# Patient Record
Sex: Male | Born: 1955 | Race: White | Hispanic: No | Marital: Married | Smoking: Never smoker
Health system: Southern US, Community
[De-identification: ages and names within clinical notes are randomized; demographics above are authoritative.]

## PROBLEM LIST (undated history)

## (undated) DIAGNOSIS — J301 Allergic rhinitis due to pollen: Secondary | ICD-10-CM

## (undated) DIAGNOSIS — I10 Essential (primary) hypertension: Secondary | ICD-10-CM

## (undated) DIAGNOSIS — N200 Calculus of kidney: Secondary | ICD-10-CM

## (undated) DIAGNOSIS — E119 Type 2 diabetes mellitus without complications: Principal | ICD-10-CM

## (undated) DIAGNOSIS — E785 Hyperlipidemia, unspecified: Secondary | ICD-10-CM

## (undated) HISTORY — DX: Type 2 diabetes mellitus without complications: E11.9

## (undated) HISTORY — DX: Hyperlipidemia, unspecified: E78.5

## (undated) HISTORY — DX: Allergic rhinitis due to pollen: J30.1

## (undated) HISTORY — DX: Essential (primary) hypertension: I10

## (undated) HISTORY — DX: Calculus of kidney: N20.0

---
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
## 1974-02-17 HISTORY — PX: SEPTOPLASTY: SUR1290

## 2012-05-03 ENCOUNTER — Telehealth: Payer: Self-pay | Admitting: Family Medicine

## 2012-05-03 NOTE — Telephone Encounter (Signed)
Pt just moved here from [place] and has only 30 days left of his meds, however the first new pt apptmt you have is 07/15/2012 at 2:00 p.m. Can you accommodate him a sooner apptmt due to his med refills? Thank you.

## 2012-05-03 NOTE — Telephone Encounter (Signed)
i should be able to see him on wed or thurs. 30 min new appt.

## 2012-05-04 NOTE — Telephone Encounter (Signed)
I made several attempts to contact patient all day.  The call goes directly to his voice mail that hasn't been set up, yet.

## 2012-05-05 NOTE — Telephone Encounter (Signed)
We are doing him a favor working him in, if he does not call back, let it go.

## 2012-05-05 NOTE — Telephone Encounter (Signed)
Noted  

## 2012-07-15 ENCOUNTER — Other Ambulatory Visit: Payer: Self-pay | Admitting: *Deleted

## 2012-07-15 ENCOUNTER — Encounter: Payer: Self-pay | Admitting: Family Medicine

## 2012-07-15 ENCOUNTER — Ambulatory Visit (INDEPENDENT_AMBULATORY_CARE_PROVIDER_SITE_OTHER): Payer: 59 | Admitting: Family Medicine

## 2012-07-15 VITALS — BP 140/92 | HR 92 | Temp 98.1°F | Ht 68.0 in | Wt 245.5 lb

## 2012-07-15 DIAGNOSIS — N200 Calculus of kidney: Secondary | ICD-10-CM

## 2012-07-15 DIAGNOSIS — E785 Hyperlipidemia, unspecified: Secondary | ICD-10-CM

## 2012-07-15 DIAGNOSIS — J301 Allergic rhinitis due to pollen: Secondary | ICD-10-CM | POA: Insufficient documentation

## 2012-07-15 DIAGNOSIS — I10 Essential (primary) hypertension: Secondary | ICD-10-CM

## 2012-07-15 MED ORDER — TADALAFIL 20 MG PO TABS: 20.0000 mg | ORAL_TABLET | Freq: Every day | ORAL | Status: DC | PRN

## 2012-07-15 NOTE — Patient Instructions (Addendum)
F/u late 2014 or early 2015 for CPX

## 2012-07-15 NOTE — Progress Notes (Signed)
Nature conservation officer at [facility] [address] [place] [address] Phone: [phone] Fax: [phone]  Date:  07/15/2012   Name:  Matthew Cooper   DOB:  1955/08/19   MRN:  [id number] Gender: male Age: 57 y.o.  Primary Physician:  Hannah Beat, MD  Evaluating MD: Hannah Beat, MD   Chief Complaint: Establish Care   History of Present Illness:  Matthew Cooper is a 57 y.o. pleasant patient who presents with the following:  Moved from [place] in February - Chartered certified accountant for [employer].   HTN: Tolerating all medications without side effects No CP, no sob. No HA.  BP Readings from Last 3 Encounters:  07/15/12 140/92   Lipids: reportedly at goal We will obtain records from the patient's prior physicians.   ED: on cialis works well.  Basic Metabolic Panel: No results found for this basename: na, k, cl, co2, bun, creatinine, glucose, calcium      There are no active problems to display for this patient.   Past Medical History  Diagnosis Date  . History of chicken pox   . Allergy   . Hypertension   . Hyperlipidemia   . Kidney stones     Past Surgical History  Procedure Laterality Date  . Tonsillectomy and adenoidectomy  1961    History   Social History  . Marital Status: Married    Spouse Name: N/A    Number of Children: N/A  . Years of Education: N/A   Occupational History  . Not on file.   Social History Main Topics  . Smoking status: Never Smoker   . Smokeless tobacco: Never Used  . Alcohol Use: No  . Drug Use: No  . Sexually Active: Not on file   Other Topics Concern  . Not on file   Social History Narrative  . No narrative on file    No family history on file.  No Known Allergies  Medication list has been reviewed and updated.  No outpatient prescriptions prior to visit.   No facility-administered medications prior to visit.    Review of Systems:   GEN: No acute illnesses, no fevers,  chills. GI: No n/v/d, eating normally Pulm: No SOB Interactive and getting along well at home.  Otherwise, ROS is as per the HPI.   Physical Examination: BP 140/92  Pulse 92  Temp(Src) 98.1 F (36.7 C) (Oral)  Ht 5\' 8"  (1.727 m)  Wt 245 lb 8 oz (111.358 kg)  BMI 37.34 kg/m2  SpO2 96%  Ideal Body Weight: Weight in (lb) to have BMI = 25: 164.1   GEN: WDWN, NAD, Non-toxic, A & O x 3 HEENT: Atraumatic, Normocephalic. Neck supple. No masses, No LAD. Ears and Nose: No external deformity. CV: RRR, No M/G/R. No JVD. No thrill. No extra heart sounds. PULM: CTA B, no wheezes, crackles, rhonchi. No retractions. No resp. distress. No accessory muscle use. EXTR: No c/c/e NEURO Normal gait.  PSYCH: Normally interactive. Conversant. Not depressed or anxious appearing.  Calm demeanor.    Assessment and Plan:  Hypertension  Hyperlipidemia  Allergic rhinitis due to pollen  Kidney stones  Doing well. Refill cialis. F/u cpx late fall  Orders Today:  No orders of the defined types were placed in this encounter.    Updated Medication List: (Includes new medications, updates to list, dose adjustments) Meds ordered this encounter  Medications  . Chlorpheniramine-DM (CORICIDIN COUGH/COLD) 4-30 MG TABS    Sig: Take by mouth as needed.  Marland Kitchen  tadalafil (CIALIS) 20 MG tablet    Sig: Take 1 tablet (20 mg total) by mouth daily as needed for erectile dysfunction.    Dispense:  24 tablet    Refill:  3    Medications Discontinued: There are no discontinued medications.    Signed, Elpidio Galea. Daran Favaro, MD 07/15/2012 2:03 PM

## 2013-01-20 ENCOUNTER — Other Ambulatory Visit: Payer: Self-pay | Admitting: *Deleted

## 2013-01-20 MED ORDER — ROSUVASTATIN CALCIUM 10 MG PO TABS: 10.0000 mg | ORAL_TABLET | Freq: Every day | ORAL | Status: DC

## 2013-01-20 MED ORDER — HYDROCHLOROTHIAZIDE 12.5 MG PO CAPS: 12.5000 mg | ORAL_CAPSULE | Freq: Every day | ORAL | Status: DC

## 2013-01-20 MED ORDER — QUINAPRIL HCL 40 MG PO TABS: 40.0000 mg | ORAL_TABLET | Freq: Every day | ORAL | Status: DC

## 2013-01-20 NOTE — Addendum Note (Signed)
Addended by: Baldomero Lamy on: 01/20/2013 01:49 PM   Modules accepted: Orders

## 2013-03-17 ENCOUNTER — Other Ambulatory Visit (INDEPENDENT_AMBULATORY_CARE_PROVIDER_SITE_OTHER): Payer: 59

## 2013-03-17 DIAGNOSIS — Z79899 Other long term (current) drug therapy: Secondary | ICD-10-CM

## 2013-03-17 DIAGNOSIS — Z125 Encounter for screening for malignant neoplasm of prostate: Secondary | ICD-10-CM

## 2013-03-17 DIAGNOSIS — E785 Hyperlipidemia, unspecified: Secondary | ICD-10-CM

## 2013-03-17 LAB — BASIC METABOLIC PANEL
BUN: 16 mg/dL (ref 6–23)
CHLORIDE: 102 meq/L (ref 96–112)
CO2: 29 mEq/L (ref 19–32)
Calcium: 9.8 mg/dL (ref 8.4–10.5)
Creatinine, Ser: 1 mg/dL (ref 0.4–1.5)
GFR: 81.5 mL/min (ref >60.00)
GLUCOSE: 106 mg/dL — AB (ref 70–99)
POTASSIUM: 4.2 meq/L (ref 3.5–5.1)
Sodium: 139 mEq/L (ref 135–145)

## 2013-03-17 LAB — LIPID PANEL
CHOL/HDL RATIO: 5
Cholesterol: 144 mg/dL (ref 0–200)
HDL: 30.4 mg/dL — ABNORMAL LOW (ref >39.00)
LDL CALC: 80 mg/dL (ref 0–99)
Triglycerides: 167 mg/dL — ABNORMAL HIGH (ref 0.0–149.0)
VLDL: 33.4 mg/dL (ref 0.0–40.0)

## 2013-03-17 LAB — CBC WITH DIFFERENTIAL/PLATELET
Basophils Absolute: 0 10*3/uL (ref 0.0–0.1)
Basophils Relative: 0.2 % (ref 0.0–3.0)
EOS ABS: 0.1 10*3/uL (ref 0.0–0.7)
EOS PCT: 1 % (ref 0.0–5.0)
HCT: 49.2 % (ref 39.0–52.0)
Hemoglobin: 16.3 g/dL (ref 13.0–17.0)
LYMPHS PCT: 30.1 % (ref 12.0–46.0)
Lymphs Abs: 2.5 10*3/uL (ref 0.7–4.0)
MCHC: 33.1 g/dL (ref 30.0–36.0)
MCV: 98.7 fl (ref 78.0–100.0)
Monocytes Absolute: 0.7 10*3/uL (ref 0.1–1.0)
Monocytes Relative: 8.4 % (ref 3.0–12.0)
NEUTROS PCT: 60.3 % (ref 43.0–77.0)
Neutro Abs: 5 10*3/uL (ref 1.4–7.7)
Platelets: 217 10*3/uL (ref 150.0–400.0)
RBC: 4.99 Mil/uL (ref 4.22–5.81)
RDW: 12.8 % (ref 11.5–14.6)
WBC: 8.3 10*3/uL (ref 4.5–10.5)

## 2013-03-17 LAB — HEPATIC FUNCTION PANEL
ALBUMIN: 4.4 g/dL (ref 3.5–5.2)
ALK PHOS: 56 U/L (ref 39–117)
ALT: 77 U/L — ABNORMAL HIGH (ref 0–53)
AST: 53 U/L — ABNORMAL HIGH (ref 0–37)
BILIRUBIN DIRECT: 0.1 mg/dL (ref 0.0–0.3)
BILIRUBIN TOTAL: 1 mg/dL (ref 0.3–1.2)
Total Protein: 6.9 g/dL (ref 6.0–8.3)

## 2013-03-17 LAB — PSA: PSA: 0.38 ng/mL (ref 0.10–4.00)

## 2013-03-23 ENCOUNTER — Encounter: Payer: 59 | Admitting: Family Medicine

## 2013-04-04 ENCOUNTER — Ambulatory Visit (INDEPENDENT_AMBULATORY_CARE_PROVIDER_SITE_OTHER): Payer: 59 | Admitting: Family Medicine

## 2013-04-04 ENCOUNTER — Encounter: Payer: Self-pay | Admitting: Family Medicine

## 2013-04-04 VITALS — BP 140/90 | HR 81 | Temp 97.0°F | Ht 68.0 in | Wt 234.6 lb

## 2013-04-04 DIAGNOSIS — Z23 Encounter for immunization: Secondary | ICD-10-CM

## 2013-04-04 DIAGNOSIS — Z0000 Encounter for general adult medical examination without abnormal findings: Secondary | ICD-10-CM

## 2013-04-04 DIAGNOSIS — Z1211 Encounter for screening for malignant neoplasm of colon: Secondary | ICD-10-CM

## 2013-04-04 MED ORDER — ROSUVASTATIN CALCIUM 10 MG PO TABS: 10.0000 mg | ORAL_TABLET | Freq: Every day | ORAL | Status: DC

## 2013-04-04 MED ORDER — SILDENAFIL CITRATE 100 MG PO TABS: 100.0000 mg | ORAL_TABLET | Freq: Every day | ORAL | Status: DC | PRN

## 2013-04-04 MED ORDER — HYDROCHLOROTHIAZIDE 12.5 MG PO CAPS: 12.5000 mg | ORAL_CAPSULE | Freq: Every day | ORAL | Status: DC

## 2013-04-04 MED ORDER — QUINAPRIL HCL 40 MG PO TABS: 40.0000 mg | ORAL_TABLET | Freq: Every day | ORAL | Status: DC

## 2013-04-04 NOTE — Progress Notes (Signed)
Date:  04/04/2013   Name:  Matthew Cooper   DOB:  [birth year]   MRN:  [id number] Gender: male Age: 58 y.o.  Primary Physician:  Owens Loffler, MD   Chief Complaint: Annual Exam   Subjective:   History of Present Illness:  Matthew Cooper is a 58 y.o. pleasant patient who presents with the following:  Preventative Health Maintenance Visit:  Health Maintenance Summary Reviewed and updated, unless pt declines services.  Tobacco History Reviewed. Alcohol: No concerns, no excessive use Exercise Habits: Some activity, rec at least 30 mins 5 times a week STD concerns: no risk or activity to increase risk Drug Use: None Encouraged self-testicular check  Health Maintenance  Topic Date Due  . Colonoscopy  12/19/2004  . Influenza Vaccine  09/17/2012  . Tetanus/tdap  04/05/2023    Immunization History  Administered Date(s) Administered  . Tdap 04/04/2013   Tdap - > 10 years Colon - done in [place], [place], reports he is due Getting Hep B shots from the city  [place] in [place], and went with some family. Went down to [facility].   Patient Active Problem List   Diagnosis Date Noted  . Hypertension   . Hyperlipidemia   . Allergic rhinitis due to pollen   . Kidney stones     Past Medical History  Diagnosis Date  . Allergic rhinitis due to pollen   . Hypertension   . Hyperlipidemia   . Kidney stones     Past Surgical History  Procedure Laterality Date  . Tonsillectomy and adenoidectomy  1961    History   Social History  . Marital Status: Married    Spouse Name: N/A    Number of Children: N/A  . Years of Education: 12   Occupational History  .  Unemployed    Freight forwarder, [employer]   Social History Main Topics  . Smoking status: Never Smoker   . Smokeless tobacco: Never Used  . Alcohol Use: No  . Drug Use: No  . Sexual Activity: Not on file   Other Topics Concern  . Not on file   Social History Narrative   Moved 03/2012 from [facility]     Married    No family history on file.  No Known Allergies  Medication list has been reviewed and updated.  Review of Systems:  General: Denies fever, chills, sweats. No significant weight loss. Eyes: Denies blurring,significant itching ENT: Denies earache, sore throat, and hoarseness. Cardiovascular: Denies chest pains, palpitations, dyspnea on exertion Respiratory: Denies cough, dyspnea at rest,wheeezing Breast: no concerns about lumps GI: Denies nausea, vomiting, diarrhea, constipation, change in bowel habits, abdominal pain, melena, hematochezia GU: Denies penile discharge, ED, urinary flow / outflow problems. No STD concerns. Musculoskeletal: Denies back pain, joint pain Derm: Denies rash, itching Neuro: Denies  paresthesias, frequent falls, frequent headaches Psych: Denies depression, anxiety Endocrine: Denies cold intolerance, heat intolerance, polydipsia Heme: Denies enlarged lymph nodes Allergy: No hayfever  Objective:   Physical Examination: BP 140/90  Pulse 81  Temp(Src) 97 F (36.1 C) (Oral)  Ht 5\' 8"  (1.727 m)  Wt 234 lb 9 oz (106.397 kg)  BMI 35.67 kg/m2  SpO2 96% Ideal Body Weight: Weight in (lb) to have BMI = 25: 164.1  GEN: well developed, well nourished, no acute distress Eyes: conjunctiva and lids normal, PERRLA, EOMI ENT: TM clear, nares clear, oral exam WNL Neck: supple, no lymphadenopathy, no thyromegaly, no JVD Pulm: clear to auscultation and percussion, respiratory effort normal CV: regular rate  and rhythm, S1-S2, no murmur, rub or gallop, no bruits, peripheral pulses normal and symmetric, no cyanosis, clubbing, edema or varicosities GI: soft, non-tender; no hepatosplenomegaly, masses; active bowel sounds all quadrants GU: no hernia, testicular mass, penile discharge Lymph: no cervical, axillary or inguinal adenopathy MSK: gait normal, muscle tone and strength WNL, no joint swelling, effusions, discoloration, crepitus  SKIN: clear, good  turgor, color WNL, no rashes, lesions, or ulcerations Neuro: normal mental status, normal strength, sensation, and motion Psych: alert; oriented to person, place and time, normally interactive and not anxious or depressed in appearance.  All labs reviewed with patient.  Lipids:    Component Value Date/Time   CHOL 144 03/17/2013 0746   TRIG 167.0* 03/17/2013 0746   HDL 30.40* 03/17/2013 0746   VLDL 33.4 03/17/2013 0746   CHOLHDL 5 03/17/2013 0746    CBC:    Component Value Date/Time   WBC 8.3 03/17/2013 0746   HGB 16.3 03/17/2013 0746   HCT 49.2 03/17/2013 0746   PLT 217.0 03/17/2013 0746   MCV 98.7 03/17/2013 0746   NEUTROABS 5.0 03/17/2013 0746   LYMPHSABS 2.5 03/17/2013 0746   MONOABS 0.7 03/17/2013 0746   EOSABS 0.1 03/17/2013 0746   BASOSABS 0.0 03/17/2013 1540    Basic Metabolic Panel:    Component Value Date/Time   NA 139 03/17/2013 0746   K 4.2 03/17/2013 0746   CL 102 03/17/2013 0746   CO2 29 03/17/2013 0746   BUN 16 03/17/2013 0746   CREATININE 1.0 03/17/2013 0746   GLUCOSE 106* 03/17/2013 0746   CALCIUM 9.8 03/17/2013 0746    Lab Results  Component Value Date   ALT 77* 03/17/2013   AST 53* 03/17/2013   ALKPHOS 56 03/17/2013   BILITOT 1.0 03/17/2013    No results found for this basename: TSH    Lab Results  Component Value Date   PSA 0.38 03/17/2013    Assessment & Plan:   Health Maintenance Exam: The patient's preventative maintenance and recommended screening tests for an annual wellness exam were reviewed in full today. Brought up to date unless services declined.  Counselled on the importance of diet, exercise, and its role in overall health and mortality. The patient's FH and SH was reviewed, including their home life, tobacco status, and drug and alcohol status.   Routine general medical examination at a health care facility - Plan: Tdap vaccine greater than or equal to 7yo IM  Special screening for malignant neoplasms, colon - Plan: Ambulatory referral to  Gastroenterology  Need for prophylactic vaccination with combined diphtheria-tetanus-pertussis (DTP) vaccine - Plan: Tdap vaccine greater than or equal to 7yo IM  Mild LFT elevation. Suggested recheck LFT's in 3-4 months to ensure stability.  Patient Instructions  REFERRALS TO SPECIALISTS, SPECIAL TESTS (MRI, CT, ULTRASOUNDS)  GO THE WAITING ROOM AND TELL CHECK IN YOU NEED HELP WITH A REFERRAL. Either MARION or LINDA will help you set it up.  If it is between 1-2 PM they may be at lunch.  After 5 PM, they will likely be at home.  They will call you, so please make sure the office has your correct phone number.  Referrals sometimes can be done same day if urgent, but others can take 2 or 3 days to get an appointment. Starting in 2015, some of the new Medicare insurance plans and [place] offered on the Exchange take longer for referrals. They have added additional paperwork and steps.  MRI's and CT's can take up  to a week for the test. (Emergencies like strokes take precedence. I will tell you if you have an emergency.)   Specialist appointment times vary a great deal, mostly on the specialist's schedule and if they have openings. -- Our office tries to get you in as fast as possible. -- Some specialists have very long wait times. (Example. Dermatology. Usually months) -- If you have a true emergency like new cancer, we work to get you in ASAP.     Orders Placed This Encounter  Procedures  . Tdap vaccine greater than or equal to 7yo IM  . Ambulatory referral to Gastroenterology    New medications, updates to list, dose adjustments: Meds ordered this encounter  Medications  . rosuvastatin (CRESTOR) 10 MG tablet    Sig: Take 1 tablet (10 mg total) by mouth daily.    Dispense:  90 tablet    Refill:  3  . hydrochlorothiazide (MICROZIDE) 12.5 MG capsule    Sig: Take 1 capsule (12.5 mg total) by mouth daily.    Dispense:  90 capsule    Refill:  3  . quinapril  (ACCUPRIL) 40 MG tablet    Sig: Take 1 tablet (40 mg total) by mouth daily.    Dispense:  90 tablet    Refill:  3  . sildenafil (VIAGRA) 100 MG tablet    Sig: Take 1 tablet (100 mg total) by mouth daily as needed for erectile dysfunction.    Dispense:  24 tablet    Refill:  3    Signed,  Kendi Defalco T. Khaleah Duer, MD, [place] at [facility] [place] [address] Phone: [phone] Fax: [phone]    Medication List       This list is accurate as of: 04/04/13 11:59 PM.  Always use your most recent med list.               CORICIDIN COUGH/COLD 4-30 MG Tabs  Generic drug:  Chlorpheniramine-DM  Take by mouth as needed.     hydrochlorothiazide 12.5 MG capsule  Commonly known as:  MICROZIDE  Take 1 capsule (12.5 mg total) by mouth daily.     quinapril 40 MG tablet  Commonly known as:  ACCUPRIL  Take 1 tablet (40 mg total) by mouth daily.     rosuvastatin 10 MG tablet  Commonly known as:  CRESTOR  Take 1 tablet (10 mg total) by mouth daily.     sildenafil 100 MG tablet  Commonly known as:  VIAGRA  Take 1 tablet (100 mg total) by mouth daily as needed for erectile dysfunction.

## 2013-04-04 NOTE — Progress Notes (Signed)
Pre-visit discussion using our clinic review tool. No additional management support is needed unless otherwise documented below in the visit note.  

## 2013-04-04 NOTE — Patient Instructions (Signed)
REFERRALS TO SPECIALISTS, SPECIAL TESTS (MRI, CT, ULTRASOUNDS)  GO THE WAITING ROOM AND TELL CHECK IN YOU NEED HELP WITH A REFERRAL. Either MARION or LINDA will help you set it up.  If it is between 1-2 PM they may be at lunch.  After 5 PM, they will likely be at home.  They will call you, so please make sure the office has your correct phone number.  Referrals sometimes can be done same day if urgent, but others can take 2 or 3 days to get an appointment. Starting in 2015, some of the new Medicare insurance plans and Affordable Care Health plans offered on the Exchange take longer for referrals. They have added additional paperwork and steps.  MRI's and CT's can take up to a week for the test. (Emergencies like strokes take precedence. I will tell you if you have an emergency.)   Specialist appointment times vary a great deal, mostly on the specialist's schedule and if they have openings. -- Our office tries to get you in as fast as possible. -- Some specialists have very long wait times. (Example. Dermatology. Usually months) -- If you have a true emergency like new cancer, we work to get you in ASAP.   

## 2013-04-07 ENCOUNTER — Encounter: Payer: Self-pay | Admitting: Internal Medicine

## 2013-05-04 ENCOUNTER — Telehealth: Payer: Self-pay | Admitting: *Deleted

## 2013-05-04 ENCOUNTER — Ambulatory Visit (AMBULATORY_SURGERY_CENTER): Payer: Self-pay | Admitting: *Deleted

## 2013-05-04 VITALS — Ht 67.0 in | Wt 230.0 lb

## 2013-05-04 DIAGNOSIS — Z860100 Personal history of colon polyps, unspecified: Secondary | ICD-10-CM

## 2013-05-04 MED ORDER — MOVIPREP 100 G PO SOLR: ORAL | Status: DC

## 2013-05-04 NOTE — Telephone Encounter (Signed)
Dr Hilarie Fredrickson: pt is scheduled for direct colonoscopy 05/18/13.  Last colonoscopy 08/2008 in [place], [place].  Rectal hyperplastic polyp.  Pt brought procedure and pathology report to [facility].  Records are on your desk for review.  Pt is not having any GI problems, no family hx of colon cancer. Brother had colon polyps.  Is pt due for colon now?  Thanks, Juliann Pulse

## 2013-05-04 NOTE — Progress Notes (Signed)
No allergies to eggs or soy. No problems with anesthesia.  

## 2013-05-09 NOTE — Telephone Encounter (Signed)
Notified patient of Dr.Pyrtle's recommendations to proceed with colonoscopy now. Patient states he's going to check with his insurance. He states he will cancel if not covered by insurance. Thanks.

## 2013-05-09 NOTE — Telephone Encounter (Signed)
I have reviewed the patient's colonoscopy which was dated 08/29/2008. The indication was screening and family history of colon polyps. Findings 7 mm sessile sigmoid polyp removed with cold snare. Pathology hyperplastic polyp Gastroenterologist, Dr. Lorie Apley recommended repeat colonoscopy in 5 years if not adenomatous. Given these findings and recommendation, patient okay to proceed to repeat surveillance colonoscopy at this time (now 5 yrs from previous procedure)

## 2013-05-18 ENCOUNTER — Ambulatory Visit (AMBULATORY_SURGERY_CENTER): Payer: 59 | Admitting: Internal Medicine

## 2013-05-18 ENCOUNTER — Encounter: Payer: Self-pay | Admitting: Internal Medicine

## 2013-05-18 VITALS — BP 106/74 | HR 69 | Temp 98.0°F | Resp 18 | Ht 67.0 in | Wt 230.0 lb

## 2013-05-18 DIAGNOSIS — D126 Benign neoplasm of colon, unspecified: Secondary | ICD-10-CM

## 2013-05-18 DIAGNOSIS — Z860100 Personal history of colon polyps, unspecified: Secondary | ICD-10-CM

## 2013-05-18 DIAGNOSIS — Z1211 Encounter for screening for malignant neoplasm of colon: Secondary | ICD-10-CM

## 2013-05-18 MED ORDER — SODIUM CHLORIDE 0.9 % IV SOLN: 500.0000 mL | INTRAVENOUS | Status: DC

## 2013-05-18 NOTE — Patient Instructions (Signed)

## 2013-05-18 NOTE — Progress Notes (Signed)
Patient denies any allergies to eggs or soy. Patient denies any problems with anesthesia.  

## 2013-05-18 NOTE — Op Note (Signed)
[place]  [address] [place], [address]   COLONOSCOPY PROCEDURE REPORT  PATIENT: Matthew Cooper, Matthew Cooper  MR#: [id number] BIRTHDATE: Dec 02, 1955 , 13  yrs. old GENDER: Male ENDOSCOPIST: Jerene Bears, MD REFERRED QP:[id number] Celedonio Savage, M.D. PROCEDURE DATE:  05/18/2013 PROCEDURE:   Colonoscopy with cold biopsy polypectomy First Screening Colonoscopy - Avg.  risk and is 50 yrs.  old or older - No.  Prior Negative Screening - Now for repeat screening. N/A  History of Adenoma - Now for follow-up colonoscopy & has been > or = to 3 yrs.  Yes hx of adenoma.  Has been 3 or more years since last colonoscopy.  Polyps Removed Today? Yes. ASA CLASS:   Class II INDICATIONS:elevated risk screening, Patient's personal history of adenomatous colon polyps, and Last colonoscopy performed 5 years ago. MEDICATIONS: MAC sedation, administered by CRNA and propofol (Diprivan) 300mg  IV  DESCRIPTION OF PROCEDURE:   After the risks benefits and alternatives of the procedure were thoroughly explained, informed consent was obtained.  A digital rectal exam revealed several skin tags.   The LB [id number]  endoscope was introduced through the anus and advanced to the cecum, which was identified by both the appendix and ileocecal valve. No adverse events experienced. The quality of the prep was good, using MoviPrep  The instrument was then slowly withdrawn as the colon was fully examined.   COLON FINDINGS: Two sessile polyps measuring 3-4 mm in size were found in the ascending colon and rectum.  Polypectomy was performed with cold forceps.  All resections were complete and all polyp tissue was completely retrieved.   The colon mucosa was otherwise normal.  Retroflexed views revealed no abnormalities. The time to cecum=2 minutes 29 seconds.  Withdrawal time=10 minutes 01 seconds. The scope was withdrawn and the procedure completed. COMPLICATIONS: There were no complications.  ENDOSCOPIC  IMPRESSION: 1.   Two sessile polyps measuring 3-4 mm in size were found in the ascending colon and rectum; Polypectomy was performed with cold forceps 2.   The colon mucosa was otherwise normal  RECOMMENDATIONS: 1.  Await pathology results 2.  Repeat Colonoscopy in 5 years. 3.  You will receive a letter within 1-2 weeks with the results of your biopsy as well as final recommendations.  Please call my office if you have not received a letter after 3 weeks.   eSigned:  Jerene Bears, MD 05/18/2013 10:33 AM     cc: The Patient and Kathryne Eriksson, MD

## 2013-05-18 NOTE — Progress Notes (Signed)
Called to room to assist during endoscopic procedure.  Patient ID and intended procedure confirmed with present staff. Received instructions for my participation in the procedure from the performing physician.  

## 2013-05-18 NOTE — Progress Notes (Signed)
Report to pacu rn, vss, bbs=clear 

## 2013-05-19 ENCOUNTER — Telehealth: Payer: Self-pay

## 2013-05-19 NOTE — Telephone Encounter (Signed)
  Follow up Call-  Call back number 05/18/2013  Post procedure Call Back phone  # [phone] cell  Permission to leave phone message Yes     Patient questions:  Do you have a fever, pain , or abdominal swelling? no Pain Score  0 *  Have you tolerated food without any problems? yes  Have you been able to return to your normal activities? yes  Do you have any questions about your discharge instructions: Diet   no Medications  no Follow up visit  no  Do you have questions or concerns about your Care? no  Actions: * If pain score is 4 or above: No action needed, pain <4.

## 2013-05-29 ENCOUNTER — Encounter: Payer: Self-pay | Admitting: Internal Medicine

## 2013-06-02 ENCOUNTER — Telehealth: Payer: Self-pay

## 2013-06-02 NOTE — Telephone Encounter (Signed)
Pt has previously seen chiropractor for shoulder and neck pain; pt wanted to know if Dr Lorelei Pont should do referral or not. I advised pt I could schedule appt for pt with Dr Lorelei Pont but he said he would call and schedule appt with chiropractor himself.

## 2014-04-19 ENCOUNTER — Ambulatory Visit (INDEPENDENT_AMBULATORY_CARE_PROVIDER_SITE_OTHER): Payer: 59 | Admitting: Family Medicine

## 2014-04-19 ENCOUNTER — Encounter: Payer: Self-pay | Admitting: Family Medicine

## 2014-04-19 VITALS — BP 152/106 | HR 76 | Temp 98.3°F | Ht 65.75 in | Wt 245.8 lb

## 2014-04-19 DIAGNOSIS — I10 Essential (primary) hypertension: Secondary | ICD-10-CM

## 2014-04-19 DIAGNOSIS — J0100 Acute maxillary sinusitis, unspecified: Secondary | ICD-10-CM

## 2014-04-19 DIAGNOSIS — E785 Hyperlipidemia, unspecified: Secondary | ICD-10-CM

## 2014-04-19 DIAGNOSIS — J019 Acute sinusitis, unspecified: Secondary | ICD-10-CM | POA: Insufficient documentation

## 2014-04-19 MED ORDER — QUINAPRIL HCL 40 MG PO TABS: 40.0000 mg | ORAL_TABLET | Freq: Every day | ORAL | Status: DC

## 2014-04-19 MED ORDER — ROSUVASTATIN CALCIUM 10 MG PO TABS: 10.0000 mg | ORAL_TABLET | Freq: Every day | ORAL | Status: DC

## 2014-04-19 MED ORDER — HYDROCHLOROTHIAZIDE 12.5 MG PO CAPS: 12.5000 mg | ORAL_CAPSULE | Freq: Every day | ORAL | Status: DC

## 2014-04-19 NOTE — Progress Notes (Signed)
   Dr. Frederico Hamman T. Mileigh Tilley, MD, [facility] [place], [address] Phone: [phone] Fax: [phone]  04/19/2014  Patient: Matthew Cooper, MRN: [id number], DOB: [birth year], 59 y.o.  Primary Physician:  Owens Loffler, MD  Chief Complaint: Medication Refill and Sinusitis  Subjective:   Matthew Cooper is a 59 y.o. very pleasant male patient who presents with the following:  Sinusitis, sick and felt run down. Some cough. Down in chest. Sinuses are clearing.   Has been running 140/90. He has also gained 30 pounds. His blood pressure is increased since he has gained weight.  He also has continued to take his Crestor without any difficulty.  BP Readings from Last 3 Encounters:  04/19/14 152/106  05/18/13 106/74  04/04/13 140/90    Needs to lose weight  Past Medical History, Surgical History, Social History, Family History, Problem List, Medications, and Allergies have been reviewed and updated if relevant.  ROS: GEN: Acute illness details above GI: Tolerating PO intake GU: maintaining adequate hydration and urination Pulm: No SOB Interactive and getting along well at home.  Otherwise, ROS is as per the HPI.   Objective:   BP 152/106 mmHg  Pulse 76  Temp(Src) 98.3 F (36.8 C) (Oral)  Ht 5' 5.75" (1.67 m)  Wt 245 lb 12 oz (111.471 kg)  BMI 39.97 kg/m2   Gen: WDWN, NAD; alert,appropriate and cooperative throughout exam  HEENT: Normocephalic and atraumatic. Throat clear, w/o exudate, no LAD, R TM clear, L TM - good landmarks, No fluid present. rhinnorhea.  Left frontal and maxillary sinuses: Tender Right frontal and maxillary sinuses: Tender  Neck: No ant or post LAD CV: RRR, No M/G/R Pulm: Breathing comfortably in no resp distress. no w/c/r Abd: S,NT,ND,+BS Extr: no c/c/e Psych: full affect, pleasant    Laboratory and Imaging Data:  Assessment and Plan:   Hyperlipidemia  Essential  hypertension  Acute maxillary sinusitis, recurrence not specified  Resolving URI with probable viral sinusitis.  Refilled all medications including ED medication.  Signed,  Maud Deed. Sumiko Ceasar, MD   Patient's Medications  New Prescriptions   No medications on file  Previous Medications   CHLORPHENIRAMINE-DM (CORICIDIN COUGH/COLD) 4-30 MG TABS    Take by mouth as needed.   SILDENAFIL (VIAGRA) 100 MG TABLET    Take 1 tablet (100 mg total) by mouth daily as needed for erectile dysfunction.  Modified Medications   Modified Medication Previous Medication   HYDROCHLOROTHIAZIDE (MICROZIDE) 12.5 MG CAPSULE hydrochlorothiazide (MICROZIDE) 12.5 MG capsule      Take 1 capsule (12.5 mg total) by mouth daily.    Take 1 capsule (12.5 mg total) by mouth daily.   QUINAPRIL (ACCUPRIL) 40 MG TABLET quinapril (ACCUPRIL) 40 MG tablet      Take 1 tablet (40 mg total) by mouth daily.    Take 1 tablet (40 mg total) by mouth daily.   ROSUVASTATIN (CRESTOR) 10 MG TABLET rosuvastatin (CRESTOR) 10 MG tablet      Take 1 tablet (10 mg total) by mouth daily.    Take 1 tablet (10 mg total) by mouth daily.  Discontinued Medications   No medications on file

## 2014-04-19 NOTE — Progress Notes (Signed)
Pre visit review using our clinic review tool, if applicable. No additional management support is needed unless otherwise documented below in the visit note. 

## 2014-04-21 ENCOUNTER — Telehealth: Payer: Self-pay | Admitting: Family Medicine

## 2014-04-21 NOTE — Telephone Encounter (Signed)
emmi emailed °

## 2014-07-13 ENCOUNTER — Other Ambulatory Visit (INDEPENDENT_AMBULATORY_CARE_PROVIDER_SITE_OTHER): Payer: 59

## 2014-07-13 DIAGNOSIS — E785 Hyperlipidemia, unspecified: Secondary | ICD-10-CM

## 2014-07-13 DIAGNOSIS — Z79899 Other long term (current) drug therapy: Secondary | ICD-10-CM | POA: Diagnosis not present

## 2014-07-13 DIAGNOSIS — Z125 Encounter for screening for malignant neoplasm of prostate: Secondary | ICD-10-CM | POA: Diagnosis not present

## 2014-07-13 LAB — CBC WITH DIFFERENTIAL/PLATELET
BASOS PCT: 0.4 % (ref 0.0–3.0)
Basophils Absolute: 0 10*3/uL (ref 0.0–0.1)
EOS ABS: 0.1 10*3/uL (ref 0.0–0.7)
Eosinophils Relative: 1.3 % (ref 0.0–5.0)
HCT: 47 % (ref 39.0–52.0)
HEMOGLOBIN: 16 g/dL (ref 13.0–17.0)
Lymphocytes Relative: 30.9 % (ref 12.0–46.0)
Lymphs Abs: 2.8 10*3/uL (ref 0.7–4.0)
MCHC: 34.2 g/dL (ref 30.0–36.0)
MCV: 96 fl (ref 78.0–100.0)
Monocytes Absolute: 0.9 10*3/uL (ref 0.1–1.0)
Monocytes Relative: 10.1 % (ref 3.0–12.0)
Neutro Abs: 5.2 10*3/uL (ref 1.4–7.7)
Neutrophils Relative %: 57.3 % (ref 43.0–77.0)
PLATELETS: 189 10*3/uL (ref 150.0–400.0)
RBC: 4.89 Mil/uL (ref 4.22–5.81)
RDW: 13.2 % (ref 11.5–15.5)
WBC: 9 10*3/uL (ref 4.0–10.5)

## 2014-07-13 LAB — LIPID PANEL
Cholesterol: 149 mg/dL (ref 0–200)
HDL: 30.9 mg/dL — AB (ref >39.00)
NONHDL: 118.1
Total CHOL/HDL Ratio: 5
Triglycerides: 281 mg/dL — ABNORMAL HIGH (ref 0.0–149.0)
VLDL: 56.2 mg/dL — ABNORMAL HIGH (ref 0.0–40.0)

## 2014-07-13 LAB — HEPATIC FUNCTION PANEL
ALBUMIN: 4.2 g/dL (ref 3.5–5.2)
ALT: 45 U/L (ref 0–53)
AST: 33 U/L (ref 0–37)
Alkaline Phosphatase: 65 U/L (ref 39–117)
Bilirubin, Direct: 0.1 mg/dL (ref 0.0–0.3)
Total Bilirubin: 0.6 mg/dL (ref 0.2–1.2)
Total Protein: 6.5 g/dL (ref 6.0–8.3)

## 2014-07-13 LAB — PSA: PSA: 0.51 ng/mL (ref 0.10–4.00)

## 2014-07-13 LAB — BASIC METABOLIC PANEL
BUN: 15 mg/dL (ref 6–23)
CALCIUM: 9.6 mg/dL (ref 8.4–10.5)
CHLORIDE: 102 meq/L (ref 96–112)
CO2: 31 mEq/L (ref 19–32)
Creatinine, Ser: 0.94 mg/dL (ref 0.40–1.50)
GFR: 87.14 mL/min (ref >60.00)
Glucose, Bld: 124 mg/dL — ABNORMAL HIGH (ref 70–99)
POTASSIUM: 4.5 meq/L (ref 3.5–5.1)
SODIUM: 139 meq/L (ref 135–145)

## 2014-07-13 LAB — LDL CHOLESTEROL, DIRECT: LDL DIRECT: 66 mg/dL

## 2014-07-25 ENCOUNTER — Ambulatory Visit (INDEPENDENT_AMBULATORY_CARE_PROVIDER_SITE_OTHER): Payer: 59 | Admitting: Family Medicine

## 2014-07-25 ENCOUNTER — Encounter: Payer: Self-pay | Admitting: Family Medicine

## 2014-07-25 VITALS — BP 140/90 | HR 78 | Temp 98.5°F | Ht 67.75 in | Wt 246.5 lb

## 2014-07-25 DIAGNOSIS — G4733 Obstructive sleep apnea (adult) (pediatric): Secondary | ICD-10-CM

## 2014-07-25 DIAGNOSIS — Z0000 Encounter for general adult medical examination without abnormal findings: Secondary | ICD-10-CM | POA: Diagnosis not present

## 2014-07-25 DIAGNOSIS — R05 Cough: Secondary | ICD-10-CM | POA: Diagnosis not present

## 2014-07-25 DIAGNOSIS — G2581 Restless legs syndrome: Secondary | ICD-10-CM

## 2014-07-25 DIAGNOSIS — R7303 Prediabetes: Secondary | ICD-10-CM

## 2014-07-25 DIAGNOSIS — R7309 Other abnormal glucose: Secondary | ICD-10-CM | POA: Diagnosis not present

## 2014-07-25 DIAGNOSIS — T464X5A Adverse effect of angiotensin-converting-enzyme inhibitors, initial encounter: Secondary | ICD-10-CM

## 2014-07-25 DIAGNOSIS — E781 Pure hyperglyceridemia: Secondary | ICD-10-CM

## 2014-07-25 MED ORDER — HYDROCHLOROTHIAZIDE 25 MG PO TABS: 25.0000 mg | ORAL_TABLET | Freq: Every day | ORAL | Status: DC

## 2014-07-25 NOTE — Patient Instructions (Signed)
Stop QUINAPRIL  Increase Hydrochlorothiazide to 25 mg. (You have 12.5 mg, so take 2 until the new bottle gets here)   REFERRALS TO SPECIALISTS, SPECIAL TESTS (MRI, CT, ULTRASOUNDS)  MARION or LINDA will help you. ASK CHECK-IN FOR HELP.  Imaging / Special Testing referrals sometimes can be done same day if EMERGENCY, but others can take 2 or 3 days to get an appointment. Starting in 2015, many of the new Medicare plans and Obamacare plans take much longer.   Specialist appointment times vary a great deal, based on their schedule / openings. -- Some specialists have very long wait times. (Example. Dermatology. Multiple months  for non-cancer)

## 2014-07-25 NOTE — Progress Notes (Signed)
Pre visit review using our clinic review tool, if applicable. No additional management support is needed unless otherwise documented below in the visit note. 

## 2014-07-25 NOTE — Progress Notes (Signed)
Dr. Frederico Hamman T. Janeth Terry, MD, [facility] Medicine [place], [address] Phone: [phone] Fax: [phone]  07/25/2014  Patient: Matthew Cooper, MRN: [id number], DOB: August 23, 1955, 59 y.o.  Primary Physician:  Owens Loffler, MD  Chief Complaint: Annual Exam  Subjective:   Matthew Cooper is a 59 y.o. pleasant patient who presents with the following:  Preventative Health Maintenance Visit and ongoing acute issues.  Health Maintenance Summary Reviewed and updated, unless pt declines services.  Tobacco History Reviewed. Alcohol: No concerns, no excessive use Exercise Habits: Some activity, rec at least 30 mins 5 times a week STD concerns: no risk or activity to increase risk Drug Use: None Encouraged self-testicular check  Patient is coughing all the time this is been ongoing for many months.  It is a dry cough and he will have some tickling in his throat, and this is a not a productive cough.  He has been on an ACE inhibitor for some time. He has never been a smoker.  Patient also has some significant obesity, and has a BMI of 38.  He snores every day virtually all of the time, and this is disrupting his home life.  He feels tired most of the time.  He also has very active legs, and they move all the time when he is trying to go to sleep.  He has to rotate his legs in bed, this is not painful, but he wonders if he has restless legs.  Legs are restless and has to move to relieve it. Will rotate his legs.  Feels like he has to move. No pain.   Body mass index is 37.75 kg/(m^2).    Health Maintenance  Topic Date Due  . HIV Screening  12/19/1969  . INFLUENZA VACCINE  04/19/2015 (Originally 09/18/2014)  . COLONOSCOPY  05/19/2018  . TETANUS/TDAP  04/05/2023   Immunization History  Administered Date(s) Administered  . Tdap 04/04/2013   Patient Active Problem List   Diagnosis Date Noted  . Hypertension   . Hyperlipidemia     . Allergic rhinitis due to pollen   . Kidney stones    Past Medical History  Diagnosis Date  . Allergic rhinitis due to pollen   . Hypertension   . Hyperlipidemia   . Kidney stones    Past Surgical History  Procedure Laterality Date  . Tonsillectomy and adenoidectomy  1961  . Septoplasty  1976   History   Social History  . Marital Status: Married    Spouse Name: N/A  . Number of Children: N/A  . Years of Education: 12   Occupational History  .  Unemployed    Freight forwarder, [employer]   Social History Main Topics  . Smoking status: Never Smoker   . Smokeless tobacco: Never Used  . Alcohol Use: No  . Drug Use: No  . Sexual Activity: Not on file   Other Topics Concern  . Not on file   Social History Narrative   Moved 03/2012 from [facility]   Married   Family History  Problem Relation Age of Onset  . Colon polyps Brother   . Colon cancer Neg Hx    No Known Allergies  Medication list has been reviewed and updated.   General: Denies fever, chills, sweats. No significant weight loss. FATIGUE Eyes: Denies blurring,significant itching ENT: Denies earache, sore throat, and hoarseness. Cardiovascular: Denies chest pains, palpitations, dyspnea on exertion Respiratory:as above Breast: no concerns about lumps GI: Denies  nausea, vomiting, diarrhea, constipation, change in bowel habits, abdominal pain, melena, hematochezia GU: Denies penile discharge, ED, urinary flow / outflow problems. No STD concerns. Musculoskeletal: Denies back pain, joint pain Derm: Denies rash, itching Neuro: Denies  paresthesias, frequent falls, frequent headaches Psych: Denies depression, anxiety Endocrine: Denies cold intolerance, heat intolerance, polydipsia Heme: Denies enlarged lymph nodes Allergy: No hayfever  Objective:   BP 140/90 mmHg  Pulse 78  Temp(Src) 98.5 F (36.9 C) (Oral)  Ht 5' 7.75" (1.721 m)  Wt 246 lb 8 oz (111.812 kg)  BMI 37.75 kg/m2 Ideal Body Weight: Weight in  (lb) to have BMI = 25: 162.9  No exam data present  GEN: well developed, well nourished, no acute distress Eyes: conjunctiva and lids normal, PERRLA, EOMI ENT: TM clear, nares clear, oral exam WNL Neck: supple, no lymphadenopathy, no thyromegaly, no JVD Pulm: clear to auscultation and percussion, respiratory effort normal CV: regular rate and rhythm, S1-S2, no murmur, rub or gallop, no bruits, peripheral pulses normal and symmetric, no cyanosis, clubbing, edema or varicosities GI: soft, non-tender; no hepatosplenomegaly, masses; active bowel sounds all quadrants GU: no hernia, testicular mass, penile discharge Lymph: no cervical, axillary or inguinal adenopathy MSK: gait normal, muscle tone and strength WNL, no joint swelling, effusions, discoloration, crepitus  SKIN: clear, good turgor, color WNL, no rashes, lesions, or ulcerations Neuro: normal mental status, normal strength, sensation, and motion Psych: alert; oriented to person, place and time, normally interactive and not anxious or depressed in appearance. All labs reviewed with patient.  Lipids:    Component Value Date/Time   CHOL 149 07/13/2014 1508   TRIG 281.0* 07/13/2014 1508   HDL 30.90* 07/13/2014 1508   LDLDIRECT 66.0 07/13/2014 1508   VLDL 56.2* 07/13/2014 1508   CHOLHDL 5 07/13/2014 1508   CBC: CBC Latest Ref Rng 07/13/2014 03/17/2013  WBC 4.0 - 10.5 K/uL 9.0 8.3  Hemoglobin 13.0 - 17.0 g/dL 16.0 16.3  Hematocrit 39.0 - 52.0 % 47.0 49.2  Platelets 150.0 - 400.0 K/uL 189.0 814.4    Basic Metabolic Panel:    Component Value Date/Time   NA 139 07/13/2014 1508   K 4.5 07/13/2014 1508   CL 102 07/13/2014 1508   CO2 31 07/13/2014 1508   BUN 15 07/13/2014 1508   CREATININE 0.94 07/13/2014 1508   GLUCOSE 124* 07/13/2014 1508   CALCIUM 9.6 07/13/2014 1508   Hepatic Function Latest Ref Rng 07/13/2014 03/17/2013  Total Protein 6.0 - 8.3 g/dL 6.5 6.9  Albumin 3.5 - 5.2 g/dL 4.2 4.4  AST 0 - 37 U/L 33 53(H)  ALT 0  - 53 U/L 45 77(H)  Alk Phosphatase 39 - 117 U/L 65 56  Total Bilirubin 0.2 - 1.2 mg/dL 0.6 1.0  Bilirubin, Direct 0.0 - 0.3 mg/dL 0.1 0.1    No results found for: TSH Lab Results  Component Value Date   PSA 0.51 07/13/2014   PSA 0.38 03/17/2013    Assessment and Plan:   Healthcare maintenance  Obstructive sleep apnea - Plan: Ambulatory referral to Pulmonology  Restless legs syndrome (RLS) - Plan: Ambulatory referral to Pulmonology  ACE-inhibitor cough  Borderline diabetes  Hypertriglyceridemia   >15 minutes spent in face to face time with patient, >50% spent in counselling or coordination of care: this is over and above health maintenance.  I think most likely the patient's cough is coming from his ACE inhibitor.  Discontinue this and elevate his hydrochlorothiazide dosing.  Close follow-up with regards to this.  Patient is chronically  fatigued, and he has a BMI of 38.  His symptoms are most suggestive of obstructive sleep apnea.  Recommend sleep evaluation.  His fasting glucose was 124 and his triglycerides were significantly elevated.  Recommended that he dramatically alter his diet and exercise patterns to decrease his risk for diabetes as well as cardiovascular risk.  Health Maintenance Exam: The patient's preventative maintenance and recommended screening tests for an annual wellness exam were reviewed in full today. Brought up to date unless services declined.  Counselled on the importance of diet, exercise, and its role in overall health and mortality. The patient's FH and SH was reviewed, including their home life, tobacco status, and drug and alcohol status.  Follow-up: Return in about 2 months (around 09/24/2014). Unless noted, follow-up in 1 year for Health Maintenance Exam.  New Prescriptions   HYDROCHLOROTHIAZIDE (HYDRODIURIL) 25 MG TABLET    Take 1 tablet (25 mg total) by mouth daily.   Orders Placed This Encounter  Procedures  . Ambulatory referral to  Pulmonology    Signed,  Frederico Hamman T. Shamirah Ivan, MD   Patient's Medications  New Prescriptions   HYDROCHLOROTHIAZIDE (HYDRODIURIL) 25 MG TABLET    Take 1 tablet (25 mg total) by mouth daily.  Previous Medications   CHLORPHENIRAMINE-DM (CORICIDIN COUGH/COLD) 4-30 MG TABS    Take by mouth as needed.   ROSUVASTATIN (CRESTOR) 10 MG TABLET    Take 1 tablet (10 mg total) by mouth daily.   SILDENAFIL (VIAGRA) 100 MG TABLET    Take 1 tablet (100 mg total) by mouth daily as needed for erectile dysfunction.  Modified Medications   No medications on file  Discontinued Medications   HYDROCHLOROTHIAZIDE (MICROZIDE) 12.5 MG CAPSULE    Take 1 capsule (12.5 mg total) by mouth daily.   QUINAPRIL (ACCUPRIL) 40 MG TABLET    Take 1 tablet (40 mg total) by mouth daily.

## 2014-09-22 ENCOUNTER — Institutional Professional Consult (permissible substitution): Payer: 59 | Admitting: Pulmonary Disease

## 2014-09-25 ENCOUNTER — Ambulatory Visit (INDEPENDENT_AMBULATORY_CARE_PROVIDER_SITE_OTHER): Payer: Commercial Managed Care - HMO | Admitting: Family Medicine

## 2014-09-25 ENCOUNTER — Encounter: Payer: Self-pay | Admitting: Family Medicine

## 2014-09-25 VITALS — BP 136/96 | HR 80 | Temp 98.6°F | Ht 67.75 in | Wt 230.5 lb

## 2014-09-25 DIAGNOSIS — I10 Essential (primary) hypertension: Secondary | ICD-10-CM | POA: Diagnosis not present

## 2014-09-25 MED ORDER — SILDENAFIL CITRATE 100 MG PO TABS: 100.0000 mg | ORAL_TABLET | Freq: Every day | ORAL | Status: DC | PRN

## 2014-09-25 NOTE — Progress Notes (Signed)
Dr. Frederico Hamman T. Gaetana Kawahara, MD, [facility] and Sports Medicine [place], [address] Phone: [phone] Fax: [phone]  09/25/2014  Patient: Matthew Cooper, MRN: [id number], DOB: 06-04-1955, 59 y.o.  Primary Physician:  Owens Loffler, MD  Chief Complaint: Follow-up  Subjective:   Matthew Cooper is a 59 y.o. very pleasant male patient who presents with the following:  Diet, now just started exercising.   BP is still up a little ACE cough better.   130/90  HTN: Tolerating all medications without side effects Stable and at goal No CP, no sob. No HA.  BP Readings from Last 3 Encounters:  09/25/14 136/96  07/25/14 140/90  04/19/14 449/675    Basic Metabolic Panel:    Component Value Date/Time   NA 139 07/13/2014 1508   K 4.5 07/13/2014 1508   CL 102 07/13/2014 1508   CO2 31 07/13/2014 1508   BUN 15 07/13/2014 1508   CREATININE 0.94 07/13/2014 1508   GLUCOSE 124* 07/13/2014 1508   CALCIUM 9.6 07/13/2014 1508      Past Medical History, Surgical History, Social History, Family History, Problem List, Medications, and Allergies have been reviewed and updated if relevant.  Patient Active Problem List   Diagnosis Date Noted  . Hypertension   . Hyperlipidemia   . Allergic rhinitis due to pollen   . Kidney stones     Past Medical History  Diagnosis Date  . Allergic rhinitis due to pollen   . Hypertension   . Hyperlipidemia   . Kidney stones     Past Surgical History  Procedure Laterality Date  . Tonsillectomy and adenoidectomy  1961  . Septoplasty  1976    History   Social History  . Marital Status: Married    Spouse Name: N/A  . Number of Children: N/A  . Years of Education: 12   Occupational History  .  Unemployed    Freight forwarder, [employer]   Social History Main Topics  . Smoking status: Never Smoker   . Smokeless tobacco: Never Used  . Alcohol Use: No  . Drug Use: No  . Sexual Activity: Not on file    Other Topics Concern  . Not on file   Social History Narrative   Moved 03/2012 from [facility]   Married    Family History  Problem Relation Age of Onset  . Colon polyps Brother   . Colon cancer Neg Hx     No Known Allergies  Medication list reviewed and updated in full in [facility].   GEN: No acute illnesses, no fevers, chills. GI: No n/v/d, eating normally Pulm: No SOB Interactive and getting along well at home.  Otherwise, ROS is as per the HPI.  Objective:   BP 136/96 mmHg  Pulse 80  Temp(Src) 98.6 F (37 C) (Oral)  Ht 5' 7.75" (1.721 m)  Wt 230 lb 8 oz (104.554 kg)  BMI 35.30 kg/m2  GEN: WDWN, NAD, Non-toxic, A & O x 3 HEENT: Atraumatic, Normocephalic. Neck supple. No masses, No LAD. Ears and Nose: No external deformity. CV: RRR, No M/G/R. No JVD. No thrill. No extra heart sounds. PULM: CTA B, no wheezes, crackles, rhonchi. No retractions. No resp. distress. No accessory muscle use. EXTR: No c/c/e NEURO Normal gait.  PSYCH: Normally interactive. Conversant. Not depressed or anxious appearing.  Calm demeanor.   Laboratory and Imaging Data:  Assessment and Plan:   Essential hypertension   tol fine, stable, advance with weight  loss  Signed,  Frederico Hamman T. Nimsi Males, MD   Patient's Medications  New Prescriptions   No medications on file  Previous Medications   CHLORPHENIRAMINE-DM (CORICIDIN COUGH/COLD) 4-30 MG TABS    Take by mouth as needed.   HYDROCHLOROTHIAZIDE (HYDRODIURIL) 25 MG TABLET    Take 1 tablet (25 mg total) by mouth daily.   ROSUVASTATIN (CRESTOR) 10 MG TABLET    Take 1 tablet (10 mg total) by mouth daily.  Modified Medications   Modified Medication Previous Medication   SILDENAFIL (VIAGRA) 100 MG TABLET sildenafil (VIAGRA) 100 MG tablet      Take 1 tablet (100 mg total) by mouth daily as needed for erectile dysfunction.    Take 1 tablet (100 mg total) by mouth daily as needed for erectile dysfunction.  Discontinued  Medications   No medications on file

## 2014-09-25 NOTE — Progress Notes (Signed)
Pre visit review using our clinic review tool, if applicable. No additional management support is needed unless otherwise documented below in the visit note. 

## 2014-11-21 ENCOUNTER — Encounter: Payer: Self-pay | Admitting: Family Medicine

## 2014-11-21 ENCOUNTER — Ambulatory Visit (INDEPENDENT_AMBULATORY_CARE_PROVIDER_SITE_OTHER): Payer: Commercial Managed Care - HMO | Admitting: Family Medicine

## 2014-11-21 VITALS — BP 140/86 | HR 86 | Temp 97.9°F | Ht 67.75 in | Wt 234.5 lb

## 2014-11-21 DIAGNOSIS — L255 Unspecified contact dermatitis due to plants, except food: Secondary | ICD-10-CM | POA: Diagnosis not present

## 2014-11-21 MED ORDER — PREDNISONE 20 MG PO TABS: ORAL_TABLET | ORAL | Status: DC

## 2014-11-21 NOTE — Assessment & Plan Note (Signed)
Treat with oral steroid taper, topical steroid and antihistamine.  Discussed avoidance of bacterial superinfection and info given.

## 2014-11-21 NOTE — Progress Notes (Signed)
   Subjective:    Patient ID: Matthew Cooper, male    DOB: March 22, 1955, 59 y.o.   MRN: [id number]  Poison Karlene Einstein This is a new problem. The current episode started in the past 7 days. The problem has been gradually worsening since onset. The affected locations include the torso, chest and right arm. The rash is characterized by blistering, itchiness and redness. He was exposed to plant contact. Pertinent negatives include no congestion, cough, facial edema, fever, joint pain, shortness of breath or sore throat. Treatments tried: chlorox! The treatment provided no relief. There is no history of allergies, asthma, eczema or varicella.      Review of Systems  Constitutional: Negative for fever.  HENT: Negative for congestion and sore throat.   Respiratory: Negative for cough and shortness of breath.   Musculoskeletal: Negative for joint pain.       Objective:   Physical Exam  Constitutional:  obese male in NAD  HENT:  Head: Normocephalic.  Mouth/Throat: Oropharynx is clear and moist.  Eyes: Conjunctivae are normal. Pupils are equal, round, and reactive to light.  Cardiovascular: Normal rate.   No murmur heard. Pulmonary/Chest: Effort normal and breath sounds normal.  Skin: Rash noted.     vesicular rash on erythematous background with excoriations.          Assessment & Plan:

## 2014-11-21 NOTE — Patient Instructions (Addendum)
Start prednisone taper, take in AMs.  Start zyrtec at bedtime.  Apply hydrocortisone cream twice daily or as needed for itching.  Call if not improving as expected.  Poison [employer] ivy is a inflammation of the skin (contact dermatitis) caused by touching the allergens on the leaves of the ivy plant following previous exposure to the plant. The rash usually appears 48 hours after exposure. The rash is usually bumps (papules) or blisters (vesicles) in a linear pattern. Depending on your own sensitivity, the rash may simply cause redness and itching, or it may also progress to blisters which may break open. These must be well cared for to prevent secondary bacterial (germ) infection, followed by scarring. Keep any open areas dry, clean, dressed, and covered with an antibacterial ointment if needed. The eyes may also get puffy. The puffiness is worst in the morning and gets better as the day progresses. This dermatitis usually heals without scarring, within 2 to 3 weeks without treatment. HOME CARE INSTRUCTIONS  Thoroughly wash with soap and water as soon as you have been exposed to poison ivy. You have about one half hour to remove the plant resin before it will cause the rash. This washing will destroy the oil or antigen on the skin that is causing, or will cause, the rash. Be sure to wash under your fingernails as any plant resin there will continue to spread the rash. Do not rub skin vigorously when washing affected area. Poison ivy cannot spread if no oil from the plant remains on your body. A rash that has progressed to weeping sores will not spread the rash unless you have not washed thoroughly. It is also important to wash any clothes you have been wearing as these may carry active allergens. The rash will return if you wear the unwashed clothing, even several days later. Avoidance of the plant in the future is the best measure. Poison ivy plant can be recognized by the number of leaves. Generally,  poison ivy has three leaves with flowering branches on a single stem. Diphenhydramine may be purchased over the counter and used as needed for itching. Do not drive with this medication if it makes you drowsy.Ask your caregiver about medication for children. SEEK MEDICAL CARE IF:  Open sores develop.  Redness spreads beyond area of rash.  You notice purulent (pus-like) discharge.  You have increased pain.  Other signs of infection develop (such as fever). Document Released: 02/01/2000 Document Revised: 04/28/2011 Document Reviewed: 07/14/2008 [facility] Patient Information 2015 [place], [place]. This information is not intended to replace advice given to you by your health care provider. Make sure you discuss any questions you have with your health care provider.

## 2014-11-21 NOTE — Progress Notes (Signed)
Pre visit review using our clinic review tool, if applicable. No additional management support is needed unless otherwise documented below in the visit note. 

## 2014-11-29 ENCOUNTER — Ambulatory Visit (INDEPENDENT_AMBULATORY_CARE_PROVIDER_SITE_OTHER): Payer: Commercial Managed Care - HMO | Admitting: Family Medicine

## 2014-11-29 ENCOUNTER — Encounter: Payer: Self-pay | Admitting: Family Medicine

## 2014-11-29 VITALS — BP 136/82 | HR 102 | Temp 98.2°F | Ht 67.75 in | Wt 240.2 lb

## 2014-11-29 DIAGNOSIS — L03319 Cellulitis of trunk, unspecified: Secondary | ICD-10-CM

## 2014-11-29 DIAGNOSIS — L03113 Cellulitis of right upper limb: Secondary | ICD-10-CM | POA: Diagnosis not present

## 2014-11-29 DIAGNOSIS — L255 Unspecified contact dermatitis due to plants, except food: Secondary | ICD-10-CM

## 2014-11-29 MED ORDER — CEPHALEXIN 500 MG PO CAPS: 1000.0000 mg | ORAL_CAPSULE | Freq: Two times a day (BID) | ORAL | Status: DC

## 2014-11-29 MED ORDER — PREDNISONE 20 MG PO TABS: ORAL_TABLET | ORAL | Status: DC

## 2014-11-29 MED ORDER — CEFTRIAXONE SODIUM 1 G IJ SOLR
1.0000 g | Freq: Once | INTRAMUSCULAR | Status: AC
  Administered 2014-11-29: 1 g via INTRAMUSCULAR

## 2014-11-29 NOTE — Progress Notes (Signed)
Dr. Frederico Hamman T. Braedin Millhouse, MD, [facility] and Sports Medicine [place], [address] Phone: [phone] Fax: [phone]  11/29/2014  Patient: Matthew Cooper, MRN: [id number], DOB: 29-Nov-1955, 59 y.o.  Primary Physician:  Owens Loffler, MD  Chief Complaint: Rash  Subjective:   Matthew Cooper is a 59 y.o. very pleasant male patient who presents with the following:  Saw Dr. Diona Browner on 11/21/2014, and that point the patient had a bad case of poison ivy. He was placed on prednisone, a 7 day taper. His rash improved, and then within the last few days his rash has resumed and he is developed some rather significant pain and worsening redness and warmth on his right torso. He is not running any systemic fevers at all. He is eating and drinking okay.  Poison ivy Prednisone.   cellulitis  Past Medical History, Surgical History, Social History, Family History, Problem List, Medications, and Allergies have been reviewed and updated if relevant.  Patient Active Problem List   Diagnosis Date Noted  . Contact dermatitis due to plant 11/21/2014  . Hypertension   . Hyperlipidemia   . Allergic rhinitis due to pollen   . Kidney stones     Past Medical History  Diagnosis Date  . Allergic rhinitis due to pollen   . Hypertension   . Hyperlipidemia   . Kidney stones     Past Surgical History  Procedure Laterality Date  . Tonsillectomy and adenoidectomy  1961  . Septoplasty  1976    Social History   Social History  . Marital Status: Married    Spouse Name: N/A  . Number of Children: N/A  . Years of Education: 12   Occupational History  .  Unemployed    Freight forwarder, [employer]   Social History Main Topics  . Smoking status: Never Smoker   . Smokeless tobacco: Never Used  . Alcohol Use: No  . Drug Use: No  . Sexual Activity: Not on file   Other Topics Concern  . Not on file   Social History Narrative   Moved 03/2012 from [facility]    Married    Family History  Problem Relation Age of Onset  . Colon polyps Brother   . Colon cancer Neg Hx     Allergies  Allergen Reactions  . Ace Inhibitors Cough   Medication list reviewed and updated in full in [facility].  GEN: No acute illnesses, no fevers, chills. GI: No n/v/d, eating normally Pulm: No SOB Interactive and getting along well at home.  Otherwise, ROS is as per the HPI.  Objective:   BP 136/82 mmHg  Pulse 102  Temp(Src) 98.2 F (36.8 C) (Oral)  Ht 5' 7.75" (1.721 m)  Wt 240 lb 4 oz (108.977 kg)  BMI 36.79 kg/m2  GEN: WDWN, NAD, Non-toxic, A & O x 3 HEENT: Atraumatic, Normocephalic. Neck supple. No masses, No LAD. Ears and Nose: No external deformity. CV: RRR, No M/G/R. No JVD. No thrill. No extra heart sounds. PULM: CTA B, no wheezes, crackles, rhonchi. No retractions. No resp. distress. No accessory muscle use. EXTR: No c/c/e NEURO Normal gait.  PSYCH: Normally interactive. Conversant. Not depressed or anxious appearing.  Calm demeanor.   SKIN: Vesicular rash in various stages of healing on his chest, torso, arms on both sides.  Quite large area of reddish, tender to palpation macular skin that is warm to touch on the right thorax.  Laboratory and Imaging Data:  Assessment  and Plan:   Cellulitis of trunk, unspecified site - Plan: cefTRIAXone (ROCEPHIN) injection 1 g  Contact dermatitis due to plant  Cellulitis of right upper extremity  Rocephin IM now.  Most concerning is his cellulitis. I'm going to treat this with IM Rocephin along with Keflex. Given that he does still have some ongoing very significant, take dermatitis, mostly going to give him some prednisone. I've asked him to call me and let us know if he is worsening at all over the next few days. If he is worsening from a cellulitis standpoint, then he is to stop his prednisone entirely. If he develops systemic fevers please call us ASAP.  New Prescriptions   CEPHALEXIN  (KEFLEX) 500 MG CAPSULE    Take 2 capsules (1,000 mg total) by mouth 2 (two) times daily.   PREDNISONE (DELTASONE) 20 MG TABLET    2 tabs po daily   No orders of the defined types were placed in this encounter.    Signed,  Maud Deed. Padraig Nhan, MD   Patient's Medications  New Prescriptions   CEPHALEXIN (KEFLEX) 500 MG CAPSULE    Take 2 capsules (1,000 mg total) by mouth 2 (two) times daily.   PREDNISONE (DELTASONE) 20 MG TABLET    2 tabs po daily  Previous Medications   CHLORPHENIRAMINE-DM (CORICIDIN COUGH/COLD) 4-30 MG TABS    Take by mouth as needed.   DIPHENHYDRAMINE (BENADRYL ALLERGY) 25 MG TABLET    Take 25 mg by mouth as needed.   HYDROCHLOROTHIAZIDE (HYDRODIURIL) 25 MG TABLET    Take 1 tablet (25 mg total) by mouth daily.   ROSUVASTATIN (CRESTOR) 10 MG TABLET    Take 1 tablet (10 mg total) by mouth daily.   SILDENAFIL (VIAGRA) 100 MG TABLET    Take 1 tablet (100 mg total) by mouth daily as needed for erectile dysfunction.  Modified Medications   No medications on file  Discontinued Medications   PREDNISONE (DELTASONE) 20 MG TABLET    3 tabs by mouth daily x 3 days, then 2 tabs by mouth daily x 2 days then 1 tab by mouth daily x 2 days

## 2014-11-29 NOTE — Progress Notes (Signed)
Pre visit review using our clinic review tool, if applicable. No additional management support is needed unless otherwise documented below in the visit note. 

## 2015-01-03 ENCOUNTER — Telehealth: Payer: Self-pay

## 2015-01-03 MED ORDER — ROSUVASTATIN CALCIUM 10 MG PO TABS: 10.0000 mg | ORAL_TABLET | Freq: Every day | ORAL | Status: DC

## 2015-01-03 NOTE — Telephone Encounter (Signed)
Pt request 90 day generic crestor to [facility]. Pt said with ins coverage at next refill is requiring pt to go to generic form of crestor. Last annual exam 07/25/14.Is it OK to switch to generic?

## 2015-01-03 NOTE — Telephone Encounter (Signed)
yes

## 2015-04-24 ENCOUNTER — Other Ambulatory Visit: Payer: Self-pay | Admitting: Nurse Practitioner

## 2015-04-24 ENCOUNTER — Ambulatory Visit
Admission: RE | Admit: 2015-04-24 | Discharge: 2015-04-24 | Disposition: A | Discharge status: Home / Self Care | Payer: Worker's Compensation | Source: Ambulatory Visit | Attending: Nurse Practitioner | Admitting: Nurse Practitioner

## 2015-04-24 DIAGNOSIS — M545 Low back pain: Secondary | ICD-10-CM

## 2015-04-26 ENCOUNTER — Telehealth: Payer: Self-pay
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
## 2015-04-26 NOTE — Telephone Encounter (Signed)
agree

## 2015-06-18 ENCOUNTER — Other Ambulatory Visit: Payer: Self-pay | Admitting: Family Medicine

## 2015-07-12 ENCOUNTER — Telehealth: Payer: Self-pay | Admitting: Family Medicine

## 2015-07-12 NOTE — Telephone Encounter (Signed)
Patient Name: Matthew Cooper DOB: [birth year] Initial Comment Caller states c/o elevated blood pressure 156/106, has had an URI infection Nurse Assessment Nurse: Vallery Sa, RN, Cathy Date/Time (Eastern Time): 07/12/2015 1:58:26 PM Confirm and document reason for call. If symptomatic, describe symptoms. You must click the next button to save text entered. ---Matthew Cooper states that his blood pressure was 156/106 today. No chest pain or severe breathing difficulty. Alert and responsive. Has the patient traveled out of the country within the last 30 days? ---No Does the patient have any new or worsening symptoms? ---Yes Will a triage be completed? ---Yes Related visit to physician within the last 2 weeks? ---Yes Does the PT have any chronic conditions? (i.e. diabetes, asthma, etc.) ---Yes List chronic conditions. ---He started antibiotics for an upper respiratory infection 3 days ago, High Blood Pressure Is this a behavioral health or substance abuse call? ---No Guidelines Guideline Title Affirmed Question Affirmed Notes High Blood Pressure [1] BP # 160 / 100 AND [2] cardiac or neurologic symptoms (e.g., chest pain, difficulty breathing, unsteady gait, blurred vision) Final Disposition User Go to ED Now Vallery Sa, RN, [place] Comments Caller feeling dizziness. He states he is out of town and will go to his closest ER. Referrals GO TO FACILITY OTHER - SPECIFY Disagree/Comply: Comply

## 2015-07-23 ENCOUNTER — Telehealth: Payer: Self-pay

## 2015-07-23 NOTE — Telephone Encounter (Signed)
Pt left v/m requesting refill on meds. When I spoke with pt he had spoken with mail order pharmacy and rx being sent to pt; pt already had refills. Pt said nothing further needed.

## 2015-08-29 ENCOUNTER — Other Ambulatory Visit (INDEPENDENT_AMBULATORY_CARE_PROVIDER_SITE_OTHER): Payer: Commercial Managed Care - HMO

## 2015-08-29 DIAGNOSIS — Z1159 Encounter for screening for other viral diseases: Secondary | ICD-10-CM

## 2015-08-29 DIAGNOSIS — R7989 Other specified abnormal findings of blood chemistry: Secondary | ICD-10-CM | POA: Diagnosis not present

## 2015-08-29 DIAGNOSIS — Z79899 Other long term (current) drug therapy: Secondary | ICD-10-CM

## 2015-08-29 DIAGNOSIS — R7309 Other abnormal glucose: Secondary | ICD-10-CM | POA: Diagnosis not present

## 2015-08-29 DIAGNOSIS — Z125 Encounter for screening for malignant neoplasm of prostate: Secondary | ICD-10-CM

## 2015-08-29 DIAGNOSIS — E785 Hyperlipidemia, unspecified: Secondary | ICD-10-CM

## 2015-08-29 DIAGNOSIS — Z114 Encounter for screening for human immunodeficiency virus [HIV]: Secondary | ICD-10-CM

## 2015-08-29 LAB — CBC WITH DIFFERENTIAL/PLATELET
BASOS ABS: 0 10*3/uL (ref 0.0–0.1)
Basophils Relative: 0.4 % (ref 0.0–3.0)
EOS ABS: 0.2 10*3/uL (ref 0.0–0.7)
Eosinophils Relative: 1.7 % (ref 0.0–5.0)
HCT: 46.5 % (ref 39.0–52.0)
Hemoglobin: 15.9 g/dL (ref 13.0–17.0)
LYMPHS ABS: 2.5 10*3/uL (ref 0.7–4.0)
LYMPHS PCT: 28 % (ref 12.0–46.0)
MCHC: 34.3 g/dL (ref 30.0–36.0)
MCV: 94.2 fl (ref 78.0–100.0)
MONOS PCT: 8.3 % (ref 3.0–12.0)
Monocytes Absolute: 0.7 10*3/uL (ref 0.1–1.0)
NEUTROS ABS: 5.5 10*3/uL (ref 1.4–7.7)
NEUTROS PCT: 61.6 % (ref 43.0–77.0)
PLATELETS: 192 10*3/uL (ref 150.0–400.0)
RBC: 4.94 Mil/uL (ref 4.22–5.81)
RDW: 13.6 % (ref 11.5–15.5)
WBC: 8.9 10*3/uL (ref 4.0–10.5)

## 2015-08-29 LAB — LIPID PANEL
Cholesterol: 176 mg/dL (ref 0–200)
HDL: 30.1 mg/dL — AB (ref >39.00)
Total CHOL/HDL Ratio: 6

## 2015-08-29 LAB — HEPATIC FUNCTION PANEL
ALT: 38 U/L (ref 0–53)
AST: 33 U/L (ref 0–37)
Albumin: 4 g/dL (ref 3.5–5.2)
Alkaline Phosphatase: 71 U/L (ref 39–117)
BILIRUBIN DIRECT: 0.1 mg/dL (ref 0.0–0.3)
BILIRUBIN TOTAL: 0.4 mg/dL (ref 0.2–1.2)
Total Protein: 6.7 g/dL (ref 6.0–8.3)

## 2015-08-29 LAB — BASIC METABOLIC PANEL
BUN: 15 mg/dL (ref 6–23)
CHLORIDE: 105 meq/L (ref 96–112)
CO2: 26 mEq/L (ref 19–32)
Calcium: 9.5 mg/dL (ref 8.4–10.5)
Creatinine, Ser: 0.8 mg/dL (ref 0.40–1.50)
GFR: 104.56 mL/min (ref >60.00)
Glucose, Bld: 145 mg/dL — ABNORMAL HIGH (ref 70–99)
POTASSIUM: 4.1 meq/L (ref 3.5–5.1)
SODIUM: 140 meq/L (ref 135–145)

## 2015-08-29 LAB — LDL CHOLESTEROL, DIRECT: LDL DIRECT: 66 mg/dL

## 2015-08-29 LAB — HEMOGLOBIN A1C: Hgb A1c MFr Bld: 6.9 % — ABNORMAL HIGH (ref 4.6–6.5)

## 2015-08-29 LAB — PSA: PSA: 0.46 ng/mL (ref 0.10–4.00)

## 2015-08-30 LAB — HEPATITIS C ANTIBODY: HCV Ab: NEGATIVE

## 2015-08-30 LAB — HIV ANTIBODY (ROUTINE TESTING W REFLEX): HIV 1&2 Ab, 4th Generation: NONREACTIVE

## 2015-09-03 ENCOUNTER — Ambulatory Visit (INDEPENDENT_AMBULATORY_CARE_PROVIDER_SITE_OTHER): Payer: Commercial Managed Care - HMO | Admitting: Family Medicine

## 2015-09-03 ENCOUNTER — Encounter: Payer: Self-pay | Admitting: Family Medicine

## 2015-09-03 VITALS — BP 130/86 | HR 97 | Temp 98.3°F | Ht 67.5 in | Wt 249.8 lb

## 2015-09-03 DIAGNOSIS — R0689 Other abnormalities of breathing: Secondary | ICD-10-CM | POA: Diagnosis not present

## 2015-09-03 DIAGNOSIS — G4700 Insomnia, unspecified: Secondary | ICD-10-CM

## 2015-09-03 DIAGNOSIS — E1310 Other specified diabetes mellitus with ketoacidosis without coma: Secondary | ICD-10-CM | POA: Diagnosis not present

## 2015-09-03 DIAGNOSIS — Z0000 Encounter for general adult medical examination without abnormal findings: Secondary | ICD-10-CM | POA: Diagnosis not present

## 2015-09-03 DIAGNOSIS — E119 Type 2 diabetes mellitus without complications: Secondary | ICD-10-CM

## 2015-09-03 DIAGNOSIS — L03116 Cellulitis of left lower limb: Secondary | ICD-10-CM

## 2015-09-03 DIAGNOSIS — E1110 Type 2 diabetes mellitus with ketoacidosis without coma: Secondary | ICD-10-CM

## 2015-09-03 HISTORY — DX: Type 2 diabetes mellitus without complications: E11.9

## 2015-09-03 MED ORDER — VARDENAFIL HCL 20 MG PO TABS: ORAL_TABLET | ORAL | Status: DC

## 2015-09-03 MED ORDER — LOSARTAN POTASSIUM 25 MG PO TABS: 25.0000 mg | ORAL_TABLET | Freq: Every day | ORAL | Status: DC

## 2015-09-03 MED ORDER — DOXYCYCLINE HYCLATE 100 MG PO TABS: 100.0000 mg | ORAL_TABLET | Freq: Two times a day (BID) | ORAL | Status: DC
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
## 2015-09-03 NOTE — Progress Notes (Signed)
Pre visit review using our clinic review tool, if applicable. No additional management support is needed unless otherwise documented below in the visit note. 

## 2015-09-15 ENCOUNTER — Other Ambulatory Visit: Payer: Self-pay | Admitting: Family Medicine

## 2015-11-06 ENCOUNTER — Ambulatory Visit: Payer: Self-pay

## 2015-11-13 ENCOUNTER — Ambulatory Visit: Payer: Self-pay

## 2015-11-20 ENCOUNTER — Ambulatory Visit: Payer: Self-pay

## 2015-12-05 ENCOUNTER — Ambulatory Visit (INDEPENDENT_AMBULATORY_CARE_PROVIDER_SITE_OTHER): Payer: Commercial Managed Care - HMO | Admitting: Family Medicine

## 2015-12-05 ENCOUNTER — Encounter: Payer: Self-pay | Admitting: Family Medicine

## 2015-12-05 VITALS — BP 120/90 | HR 90 | Temp 98.4°F | Ht 67.5 in | Wt 241.2 lb

## 2015-12-05 DIAGNOSIS — E784 Other hyperlipidemia: Secondary | ICD-10-CM | POA: Diagnosis not present

## 2015-12-05 DIAGNOSIS — E119 Type 2 diabetes mellitus without complications: Secondary | ICD-10-CM | POA: Diagnosis not present

## 2015-12-05 DIAGNOSIS — E7849 Other hyperlipidemia: Secondary | ICD-10-CM
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
## 2015-12-05 NOTE — Progress Notes (Signed)
Pre visit review using our clinic review tool, if applicable. No additional management support is needed unless otherwise documented below in the visit note. 

## 2015-12-06 LAB — BASIC METABOLIC PANEL
BUN: 14 mg/dL (ref 6–23)
CALCIUM: 10.2 mg/dL (ref 8.4–10.5)
CO2: 30 meq/L (ref 19–32)
CREATININE: 0.87 mg/dL (ref 0.40–1.50)
Chloride: 100 mEq/L (ref 96–112)
GFR: 94.83 mL/min (ref >60.00)
Glucose, Bld: 77 mg/dL (ref 70–99)
Potassium: 4 mEq/L (ref 3.5–5.1)
Sodium: 139 mEq/L (ref 135–145)

## 2015-12-06 LAB — HEMOGLOBIN A1C: HEMOGLOBIN A1C: 6.4 % (ref 4.6–6.5)

## 2016-01-22 LAB — HM DIABETES EYE EXAM

## 2016-03-06 ENCOUNTER — Other Ambulatory Visit: Payer: Self-pay

## 2016-05-05 ENCOUNTER — Other Ambulatory Visit (INDEPENDENT_AMBULATORY_CARE_PROVIDER_SITE_OTHER): Payer: Commercial Managed Care - HMO

## 2016-05-05 DIAGNOSIS — E119 Type 2 diabetes mellitus without complications: Secondary | ICD-10-CM

## 2016-05-05 DIAGNOSIS — E7849 Other hyperlipidemia: Secondary | ICD-10-CM

## 2016-05-05 DIAGNOSIS — E784 Other hyperlipidemia: Secondary | ICD-10-CM | POA: Diagnosis not present

## 2016-05-05 LAB — LIPID PANEL
CHOLESTEROL: 129 mg/dL (ref 0–200)
HDL: 31.4 mg/dL — AB (ref >39.00)
LDL Cholesterol: 66 mg/dL (ref 0–99)
NonHDL: 97.66
Total CHOL/HDL Ratio: 4
Triglycerides: 157 mg/dL — ABNORMAL HIGH (ref 0.0–149.0)
VLDL: 31.4 mg/dL (ref 0.0–40.0)

## 2016-05-05 LAB — HEMOGLOBIN A1C: Hgb A1c MFr Bld: 6.2 % (ref 4.6–6.5)

## 2016-05-06 ENCOUNTER — Other Ambulatory Visit: Payer: Self-pay

## 2016-05-30 ENCOUNTER — Other Ambulatory Visit: Payer: Self-pay | Admitting: Family Medicine

## 2016-06-04 ENCOUNTER — Ambulatory Visit (INDEPENDENT_AMBULATORY_CARE_PROVIDER_SITE_OTHER): Payer: Commercial Managed Care - HMO | Admitting: Family Medicine

## 2016-06-04 ENCOUNTER — Encounter: Payer: Self-pay | Admitting: Family Medicine

## 2016-06-04 VITALS — BP 120/82 | HR 79 | Temp 98.2°F | Ht 67.5 in | Wt 234.0 lb

## 2016-06-04 DIAGNOSIS — I10 Essential (primary) hypertension: Secondary | ICD-10-CM | POA: Diagnosis not present

## 2016-06-04 DIAGNOSIS — E7849 Other hyperlipidemia: Secondary | ICD-10-CM

## 2016-06-04 DIAGNOSIS — E119 Type 2 diabetes mellitus without complications: Secondary | ICD-10-CM

## 2016-06-04 DIAGNOSIS — E784 Other hyperlipidemia: Secondary | ICD-10-CM | POA: Diagnosis not present

## 2016-06-04 LAB — MICROALBUMIN / CREATININE URINE RATIO
Creatinine,U: 204.2 mg/dL
MICROALB UR: 1.3 mg/dL (ref 0.0–1.9)
Microalb Creat Ratio: 0.6 mg/g (ref 0.0–30.0)

## 2016-06-04 MED ORDER — HYDROCHLOROTHIAZIDE 25 MG PO TABS: 25.0000 mg | ORAL_TABLET | Freq: Every day | ORAL | 3 refills | Status: DC

## 2016-06-04 MED ORDER — ROSUVASTATIN CALCIUM 10 MG PO TABS: 10.0000 mg | ORAL_TABLET | Freq: Every day | ORAL | 3 refills | Status: DC

## 2016-06-04 MED ORDER — VARDENAFIL HCL 20 MG PO TABS: ORAL_TABLET | ORAL | 11 refills | Status: AC

## 2016-06-04 NOTE — Progress Notes (Signed)
Pre visit review using our clinic review tool, if applicable. No additional management support is needed unless otherwise documented below in the visit note. 

## 2016-06-04 NOTE — Progress Notes (Signed)
urine   Dr. Frederico Hamman T. Uchenna Seufert, MD, [facility] [place], [address] Phone: [phone] Fax: [phone]  06/04/2016  Patient: Matthew Cooper, MRN: [id number], DOB: [birth year], 61 y.o.  Primary Physician:  Owens Loffler, MD   Chief Complaint  Patient presents with  . Follow-up    6 month   Subjective:   Matthew Cooper is a 61 y.o. very pleasant male patient who presents with the following:  Wt Readings from Last 3 Encounters:  06/04/16 234 lb (106.1 kg)  12/05/15 241 lb 4 oz (109.4 kg)  09/03/15 249 lb 12 oz (113.3 kg)    Joined [employer].   DM htn doing well  Diabetes Mellitus: Tolerating Medications: yes Compliance with diet: fair Exercise: minimal / intermittent Avg blood sugars at home: not checking Foot problems: none Hypoglycemia: none No nausea, vomitting, blurred vision, polyuria.  Lab Results  Component Value Date   HGBA1C 6.2 05/05/2016   HGBA1C 6.4 12/05/2015   HGBA1C 6.9 (H) 08/29/2015   Lab Results  Component Value Date   MICROALBUR 1.3 06/04/2016   LDLCALC 66 05/05/2016   CREATININE 0.87 12/05/2015    Wt Readings from Last 3 Encounters:  06/04/16 234 lb (106.1 kg)  12/05/15 241 lb 4 oz (109.4 kg)  09/03/15 249 lb 12 oz (113.3 kg)    Body mass index is 36.11 kg/m.   HTN: Tolerating all medications without side effects Stable and at goal No CP, no sob. No HA.  BP Readings from Last 3 Encounters:  06/04/16 120/82  12/05/15 120/90  09/03/15 182/99    Basic Metabolic Panel:    Component Value Date/Time   NA 139 12/05/2015 1617   K 4.0 12/05/2015 1617   CL 100 12/05/2015 1617   CO2 30 12/05/2015 1617   BUN 14 12/05/2015 1617   CREATININE 0.87 12/05/2015 1617   GLUCOSE 77 12/05/2015 1617   CALCIUM 10.2 12/05/2015 1617    Lipids: Doing well, stable. Tolerating meds fine with no SE. Panel reviewed with patient.  Lipids:    Component Value Date/Time   CHOL 129  05/05/2016 0849   TRIG 157.0 (H) 05/05/2016 0849   HDL 31.40 (L) 05/05/2016 0849   LDLDIRECT 66.0 08/29/2015 0746   VLDL 31.4 05/05/2016 0849   CHOLHDL 4 05/05/2016 0849    Lab Results  Component Value Date   ALT 38 08/29/2015   AST 33 08/29/2015   ALKPHOS 71 08/29/2015   BILITOT 0.4 08/29/2015     Past Medical History, Surgical History, Social History, Family History, Problem List, Medications, and Allergies have been reviewed and updated if relevant.  Patient Active Problem List   Diagnosis Date Noted  . Diabetes mellitus type 2, diet-controlled ([place]) 09/03/2015  . Hypertension   . Hyperlipidemia   . Allergic rhinitis due to pollen   . Kidney stones     Past Medical History:  Diagnosis Date  . Allergic rhinitis due to pollen   . Diabetes mellitus type 2, diet-controlled ([place]) 09/03/2015  . Hyperlipidemia   . Hypertension   . Kidney stones     Past Surgical History:  Procedure Laterality Date  . SEPTOPLASTY  1976  . TONSILLECTOMY AND ADENOIDECTOMY  1961    Social History   Social History  . Marital status: Married    Spouse name: N/A  . Number of children: N/A  . Years of education: 91   Occupational History  .  Unemployed    Freight forwarder,  [employer]   Social History Main Topics  . Smoking status: Never Smoker  . Smokeless tobacco: Never Used  . Alcohol use No  . Drug use: No  . Sexual activity: Not on file   Other Topics Concern  . Not on file   Social History Narrative   Moved 03/2012 from [facility]   Married    Family History  Problem Relation Age of Onset  . Colon polyps Brother   . Colon cancer Neg Hx     Allergies  Allergen Reactions  . Ace Inhibitors Cough    Medication list reviewed and updated in full in [place].   GEN: No acute illnesses, no fevers, chills. GI: No n/v/d, eating normally Pulm: No SOB Interactive and getting along well at home.  Otherwise, ROS is as per the HPI.  Objective:   BP 120/82   Pulse  79   Temp 98.2 F (36.8 C) (Oral)   Ht 5' 7.5" (1.715 m)   Wt 234 lb (106.1 kg)   BMI 36.11 kg/m   GEN: WDWN, NAD, Non-toxic, A & O x 3 HEENT: Atraumatic, Normocephalic. Neck supple. No masses, No LAD. Ears and Nose: No external deformity. CV: RRR, No M/G/R. No JVD. No thrill. No extra heart sounds. PULM: CTA B, no wheezes, crackles, rhonchi. No retractions. No resp. distress. No accessory muscle use. EXTR: No c/c/e NEURO Normal gait.  PSYCH: Normally interactive. Conversant. Not depressed or anxious appearing.  Calm demeanor.   Laboratory and Imaging Data:  Assessment and Plan:   Diabetes mellitus type 2, diet-controlled ([place])  Diabetes mellitus without complication ([place]) - Plan: Microalbumin / creatinine urine ratio  Other hyperlipidemia  Essential hypertension  He is doing remarkably well.  He is very motivated, he has changed his diet and exercise patterns, and his diabetes, cholesterol, and blood pressure are all well controlled now.  Follow-up: Return in about 6 months (around 12/04/2016) for Complete physical .  Meds ordered this encounter  Medications  . vardenafil (LEVITRA) 20 MG tablet    Sig: 1/4 tab to 1 full tablet 30 mins before intercourse    Dispense:  5 tablet    Refill:  11  . rosuvastatin (CRESTOR) 10 MG tablet    Sig: Take 1 tablet (10 mg total) by mouth daily.    Dispense:  90 tablet    Refill:  3  . hydrochlorothiazide (HYDRODIURIL) 25 MG tablet    Sig: Take 1 tablet (25 mg total) by mouth daily.    Dispense:  90 tablet    Refill:  3   Medications Discontinued During This Encounter  Medication Reason  . vardenafil (LEVITRA) 20 MG tablet Reorder  . rosuvastatin (CRESTOR) 10 MG tablet Reorder  . hydrochlorothiazide (HYDRODIURIL) 25 MG tablet Reorder   Orders Placed This Encounter  Procedures  . HM DIABETES EYE EXAM    Signed,  Lexandra Rettke T. Jalysa Swopes, MD   Allergies as of 06/04/2016      Reactions   Ace Inhibitors Cough        Medication List       Accurate as of 06/04/16 11:59 PM. Always use your most recent med list.          hydrochlorothiazide 25 MG tablet Commonly known as:  HYDRODIURIL Take 1 tablet (25 mg total) by mouth daily.   losartan 25 MG tablet Commonly known as:  COZAAR TAKE 1 TABLET BY MOUTH  DAILY   rosuvastatin 10 MG tablet Commonly known as:  CRESTOR  Take 1 tablet (10 mg total) by mouth daily.   vardenafil 20 MG tablet Commonly known as:  LEVITRA 1/4 tab to 1 full tablet 30 mins before intercourse

## 2016-09-10 ENCOUNTER — Other Ambulatory Visit: Payer: Self-pay | Admitting: Family Medicine

## 2016-12-08 ENCOUNTER — Other Ambulatory Visit (INDEPENDENT_AMBULATORY_CARE_PROVIDER_SITE_OTHER): Payer: 59

## 2016-12-08 DIAGNOSIS — Z0000 Encounter for general adult medical examination without abnormal findings: Secondary | ICD-10-CM

## 2016-12-08 DIAGNOSIS — E785 Hyperlipidemia, unspecified: Secondary | ICD-10-CM | POA: Diagnosis not present

## 2016-12-08 LAB — CBC WITH DIFFERENTIAL/PLATELET
BASOS ABS: 0 10*3/uL (ref 0.0–0.1)
BASOS PCT: 0.4 % (ref 0.0–3.0)
EOS PCT: 1.3 % (ref 0.0–5.0)
Eosinophils Absolute: 0.1 10*3/uL (ref 0.0–0.7)
HEMATOCRIT: 46 % (ref 39.0–52.0)
Hemoglobin: 15.8 g/dL (ref 13.0–17.0)
LYMPHS ABS: 2.5 10*3/uL (ref 0.7–4.0)
LYMPHS PCT: 27.6 % (ref 12.0–46.0)
MCHC: 34.3 g/dL (ref 30.0–36.0)
MCV: 96.4 fl (ref 78.0–100.0)
Monocytes Absolute: 0.7 10*3/uL (ref 0.1–1.0)
Monocytes Relative: 8.2 % (ref 3.0–12.0)
NEUTROS ABS: 5.7 10*3/uL (ref 1.4–7.7)
NEUTROS PCT: 62.5 % (ref 43.0–77.0)
PLATELETS: 202 10*3/uL (ref 150.0–400.0)
RBC: 4.78 Mil/uL (ref 4.22–5.81)
RDW: 12.8 % (ref 11.5–15.5)
WBC: 9.1 10*3/uL (ref 4.0–10.5)

## 2016-12-08 LAB — HEPATIC FUNCTION PANEL
ALK PHOS: 56 U/L (ref 39–117)
ALT: 35 U/L (ref 0–53)
AST: 26 U/L (ref 0–37)
Albumin: 4.2 g/dL (ref 3.5–5.2)
BILIRUBIN DIRECT: 0.1 mg/dL (ref 0.0–0.3)
BILIRUBIN TOTAL: 0.7 mg/dL (ref 0.2–1.2)
Total Protein: 6.6 g/dL (ref 6.0–8.3)

## 2016-12-08 LAB — BASIC METABOLIC PANEL
BUN: 12 mg/dL (ref 6–23)
CALCIUM: 9.7 mg/dL (ref 8.4–10.5)
CO2: 30 meq/L (ref 19–32)
CREATININE: 0.81 mg/dL (ref 0.40–1.50)
Chloride: 100 mEq/L (ref 96–112)
GFR: 102.64 mL/min (ref >60.00)
Glucose, Bld: 119 mg/dL — ABNORMAL HIGH (ref 70–99)
Potassium: 4.2 mEq/L (ref 3.5–5.1)
Sodium: 138 mEq/L (ref 135–145)

## 2016-12-08 LAB — LDL CHOLESTEROL, DIRECT: LDL DIRECT: 77 mg/dL

## 2016-12-08 LAB — LIPID PANEL
CHOL/HDL RATIO: 5
CHOLESTEROL: 155 mg/dL (ref 0–200)
HDL: 31.5 mg/dL — ABNORMAL LOW (ref >39.00)
NonHDL: 123.02
Triglycerides: 329 mg/dL — ABNORMAL HIGH (ref 0.0–149.0)
VLDL: 65.8 mg/dL — ABNORMAL HIGH (ref 0.0–40.0)

## 2016-12-08 LAB — PSA: PSA: 0.52 ng/mL (ref 0.10–4.00)

## 2016-12-15 ENCOUNTER — Encounter: Payer: Self-pay | Admitting: Family Medicine

## 2016-12-15 ENCOUNTER — Ambulatory Visit (INDEPENDENT_AMBULATORY_CARE_PROVIDER_SITE_OTHER): Payer: 59 | Admitting: Family Medicine

## 2016-12-15 VITALS — BP 126/80 | HR 83 | Temp 98.5°F | Ht 67.5 in | Wt 232.5 lb

## 2016-12-15 DIAGNOSIS — Z0000 Encounter for general adult medical examination without abnormal findings: Secondary | ICD-10-CM | POA: Diagnosis not present

## 2016-12-15 MED ORDER — LOSARTAN POTASSIUM 25 MG PO TABS: 25.0000 mg | ORAL_TABLET | Freq: Every day | ORAL | 3 refills | Status: AC

## 2016-12-15 MED ORDER — HYDROCHLOROTHIAZIDE 25 MG PO TABS: 25.0000 mg | ORAL_TABLET | Freq: Every day | ORAL | 3 refills | Status: AC

## 2016-12-15 MED ORDER — ROSUVASTATIN CALCIUM 10 MG PO TABS: 10.0000 mg | ORAL_TABLET | Freq: Every day | ORAL | 3 refills | Status: AC

## 2016-12-15 NOTE — Progress Notes (Signed)
Dr. Frederico Hamman T. Koben Daman, MD, [facility] [place], [address] Phone: [phone] Fax: [phone]  12/15/2016  Patient: Matthew Cooper, MRN: [id number], DOB: 12-14-1955, 61 y.o.  Primary Physician:  Owens Loffler, MD   Chief Complaint  Patient presents with  . Annual Exam   Subjective:   Matthew Cooper is a 61 y.o. pleasant patient who presents with the following:  Preventative Health Maintenance Visit:  Health Maintenance Summary Reviewed and updated, unless pt declines services.  Doing well, losing weight  Tobacco History Reviewed. Alcohol: No concerns, no excessive use Exercise Habits: Some activity, rec at least 30 mins 5 times a week STD concerns: no risk or activity to increase risk Drug Use: None Encouraged self-testicular check  Health Maintenance  Topic Date Due  . PNEUMOCOCCAL POLYSACCHARIDE VACCINE (1) 12/19/1956  . FOOT EXAM  12/19/1964  . HEMOGLOBIN A1C  11/05/2016  . INFLUENZA VACCINE  05/17/2017 (Originally 09/17/2016)  . OPHTHALMOLOGY EXAM  01/21/2017  . COLONOSCOPY  05/19/2018  . TETANUS/TDAP  04/05/2023  . Hepatitis C Screening  Completed  . HIV Screening  Completed   Immunization History  Administered Date(s) Administered  . Tdap 04/04/2013   Patient Active Problem List   Diagnosis Date Noted  . Diabetes mellitus type 2, diet-controlled ([place]) 09/03/2015  . Hypertension   . Hyperlipidemia   . Allergic rhinitis due to pollen   . Kidney stones    Past Medical History:  Diagnosis Date  . Allergic rhinitis due to pollen   . Diabetes mellitus type 2, diet-controlled ([place]) 09/03/2015  . Hyperlipidemia   . Hypertension   . Kidney stones    Past Surgical History:  Procedure Laterality Date  . SEPTOPLASTY  1976  . TONSILLECTOMY AND ADENOIDECTOMY  1961   Social History   Social History  . Marital status: Married    Spouse name: N/A  . Number of children: N/A  . Years of  education: 18   Occupational History  .  Unemployed    Freight forwarder, [employer]   Social History Main Topics  . Smoking status: Never Smoker  . Smokeless tobacco: Never Used  . Alcohol use No  . Drug use: No  . Sexual activity: Not on file   Other Topics Concern  . Not on file   Social History Narrative   Moved 03/2012 from [facility]   Married   Family History  Problem Relation Age of Onset  . Colon polyps Brother   . Colon cancer Neg Hx    Allergies  Allergen Reactions  . Ace Inhibitors Cough    Medication list has been reviewed and updated.   General: Denies fever, chills, sweats. No significant weight loss. Eyes: Denies blurring,significant itching ENT: Denies earache, sore throat, and hoarseness. Cardiovascular: Denies chest pains, palpitations, dyspnea on exertion Respiratory: Denies cough, dyspnea at rest,wheeezing Breast: no concerns about lumps GI: Denies nausea, vomiting, diarrhea, constipation, change in bowel habits, abdominal pain, melena, hematochezia GU: Denies penile discharge, ED, urinary flow / outflow problems. No STD concerns. Musculoskeletal: Denies back pain, joint pain Derm: Denies rash, itching Neuro: Denies  paresthesias, frequent falls, frequent headaches Psych: Denies depression, anxiety Endocrine: Denies cold intolerance, heat intolerance, polydipsia Heme: Denies enlarged lymph nodes Allergy: No hayfever  Objective:   BP 126/80   Pulse 83   Temp 98.5 F (36.9 C) (Oral)   Ht 5' 7.5" (1.715 m)   Wt 232 lb 8 oz (105.5 kg)   BMI  35.88 kg/m  Ideal Body Weight: Weight in (lb) to have BMI = 25: 161.7  No exam data present  GEN: well developed, well nourished, no acute distress Eyes: conjunctiva and lids normal, PERRLA, EOMI ENT: TM clear, nares clear, oral exam WNL Neck: supple, no lymphadenopathy, no thyromegaly, no JVD Pulm: clear to auscultation and percussion, respiratory effort normal CV: regular rate and rhythm, S1-S2, no  murmur, rub or gallop, no bruits, peripheral pulses normal and symmetric, no cyanosis, clubbing, edema or varicosities GI: soft, non-tender; no hepatosplenomegaly, masses; active bowel sounds all quadrants GU: no hernia, testicular mass, penile discharge Lymph: no cervical, axillary or inguinal adenopathy MSK: gait normal, muscle tone and strength WNL, no joint swelling, effusions, discoloration, crepitus  SKIN: clear, good turgor, color WNL, no rashes, lesions, or ulcerations Neuro: normal mental status, normal strength, sensation, and motion Psych: alert; oriented to person, place and time, normally interactive and not anxious or depressed in appearance. All labs reviewed with patient.  Lipids:    Component Value Date/Time   CHOL 155 12/08/2016 0816   TRIG 329.0 (H) 12/08/2016 0816   HDL 31.50 (L) 12/08/2016 0816   LDLDIRECT 77.0 12/08/2016 0816   VLDL 65.8 (H) 12/08/2016 0816   CHOLHDL 5 12/08/2016 0816   CBC: CBC Latest Ref Rng & Units 12/08/2016 08/29/2015 07/13/2014  WBC 4.0 - 10.5 K/uL 9.1 8.9 9.0  Hemoglobin 13.0 - 17.0 g/dL 15.8 15.9 16.0  Hematocrit 39.0 - 52.0 % 46.0 46.5 47.0  Platelets 150.0 - 400.0 K/uL 202.0 192.0 876.8    Basic Metabolic Panel:    Component Value Date/Time   NA 138 12/08/2016 0816   K 4.2 12/08/2016 0816   CL 100 12/08/2016 0816   CO2 30 12/08/2016 0816   BUN 12 12/08/2016 0816   CREATININE 0.81 12/08/2016 0816   GLUCOSE 119 (H) 12/08/2016 0816   CALCIUM 9.7 12/08/2016 0816   Hepatic Function Latest Ref Rng & Units 12/08/2016 08/29/2015 07/13/2014  Total Protein 6.0 - 8.3 g/dL 6.6 6.7 6.5  Albumin 3.5 - 5.2 g/dL 4.2 4.0 4.2  AST 0 - 37 U/L 26 33 33  ALT 0 - 53 U/L 35 38 45  Alk Phosphatase 39 - 117 U/L 56 71 65  Total Bilirubin 0.2 - 1.2 mg/dL 0.7 0.4 0.6  Bilirubin, Direct 0.0 - 0.3 mg/dL 0.1 0.1 0.1    No results found for: TSH Lab Results  Component Value Date   PSA 0.52 12/08/2016   PSA 0.46 08/29/2015   PSA 0.51 07/13/2014     Assessment and Plan:   Healthcare maintenance  Health Maintenance Exam: The patient's preventative maintenance and recommended screening tests for an annual wellness exam were reviewed in full today. Brought up to date unless services declined.  Counselled on the importance of diet, exercise, and its role in overall health and mortality. The patient's FH and SH was reviewed, including their home life, tobacco status, and drug and alcohol status.  Follow-up in 1 year for physical exam or additional follow-up below.  Follow-up: Return in about 6 months (around 06/15/2017). Or follow-up in 1 year if not noted.  Meds ordered this encounter  Medications  . hydrochlorothiazide (HYDRODIURIL) 25 MG tablet    Sig: Take 1 tablet (25 mg total) by mouth daily.    Dispense:  90 tablet    Refill:  3  . losartan (COZAAR) 25 MG tablet    Sig: Take 1 tablet (25 mg total) by mouth daily.    Dispense:  90 tablet    Refill:  3  . rosuvastatin (CRESTOR) 10 MG tablet    Sig: Take 1 tablet (10 mg total) by mouth daily.    Dispense:  90 tablet    Refill:  3   Medications Discontinued During This Encounter  Medication Reason  . hydrochlorothiazide (HYDRODIURIL) 25 MG tablet Reorder  . losartan (COZAAR) 25 MG tablet Reorder  . rosuvastatin (CRESTOR) 10 MG tablet Reorder   Signed,  Frederico Hamman T. Penny Frisbie, MD   Allergies as of 12/15/2016      Reactions   Ace Inhibitors Cough      Medication List       Accurate as of 12/15/16  9:38 AM. Always use your most recent med list.          hydrochlorothiazide 25 MG tablet Commonly known as:  HYDRODIURIL Take 1 tablet (25 mg total) by mouth daily.   losartan 25 MG tablet Commonly known as:  COZAAR Take 1 tablet (25 mg total) by mouth daily.   rosuvastatin 10 MG tablet Commonly known as:  CRESTOR Take 1 tablet (10 mg total) by mouth daily.   vardenafil 20 MG tablet Commonly known as:  LEVITRA 1/4 tab to 1 full tablet 30 mins before  intercourse

## 2017-06-17 ENCOUNTER — Ambulatory Visit: Payer: Self-pay | Admitting: Family Medicine

## 2017-12-21 IMAGING — CR DG LUMBAR SPINE COMPLETE 4+V
5 series · 5 of 5 positions shown · non-contrast
Comparison: None in PACs

CLINICAL DATA: Direct trauma to the low back with a fork lift
yesterday with subsequent fall down 12 foot ditch ; soft tissue
swelling and pain in that region with pain radiating down the right
leg

EXAM:
LUMBAR SPINE - COMPLETE 4+ VIEW

[w lumbar spine ap]
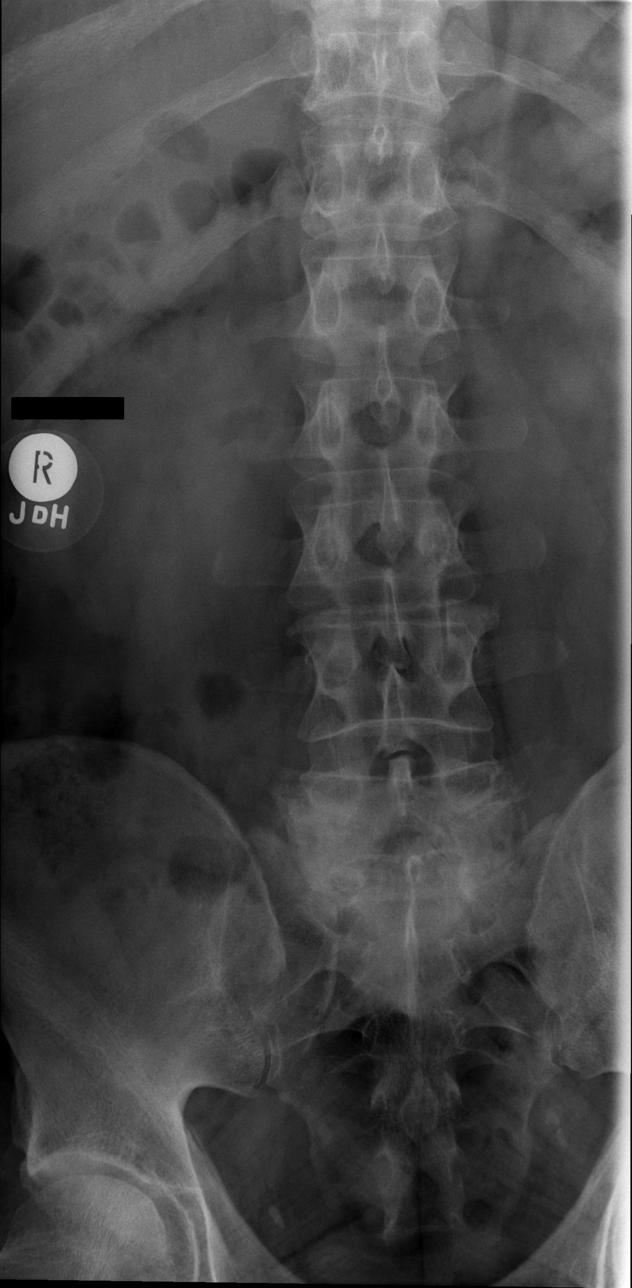

[w lumbar spine obl (1 of 2)]
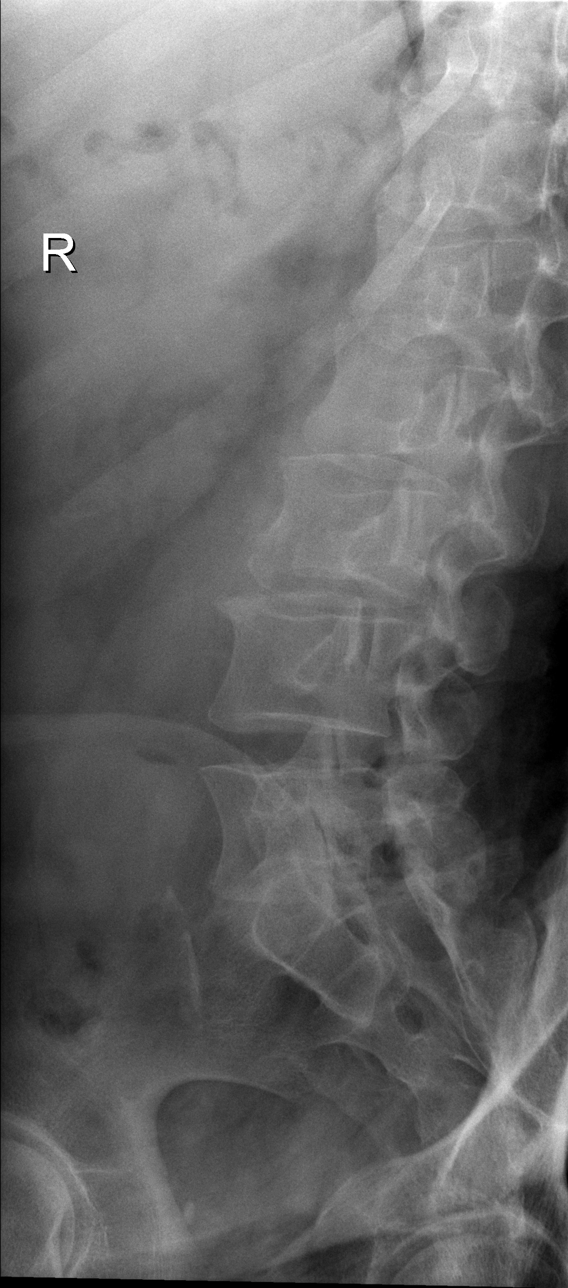

[w lumbar spine obl (2 of 2)]
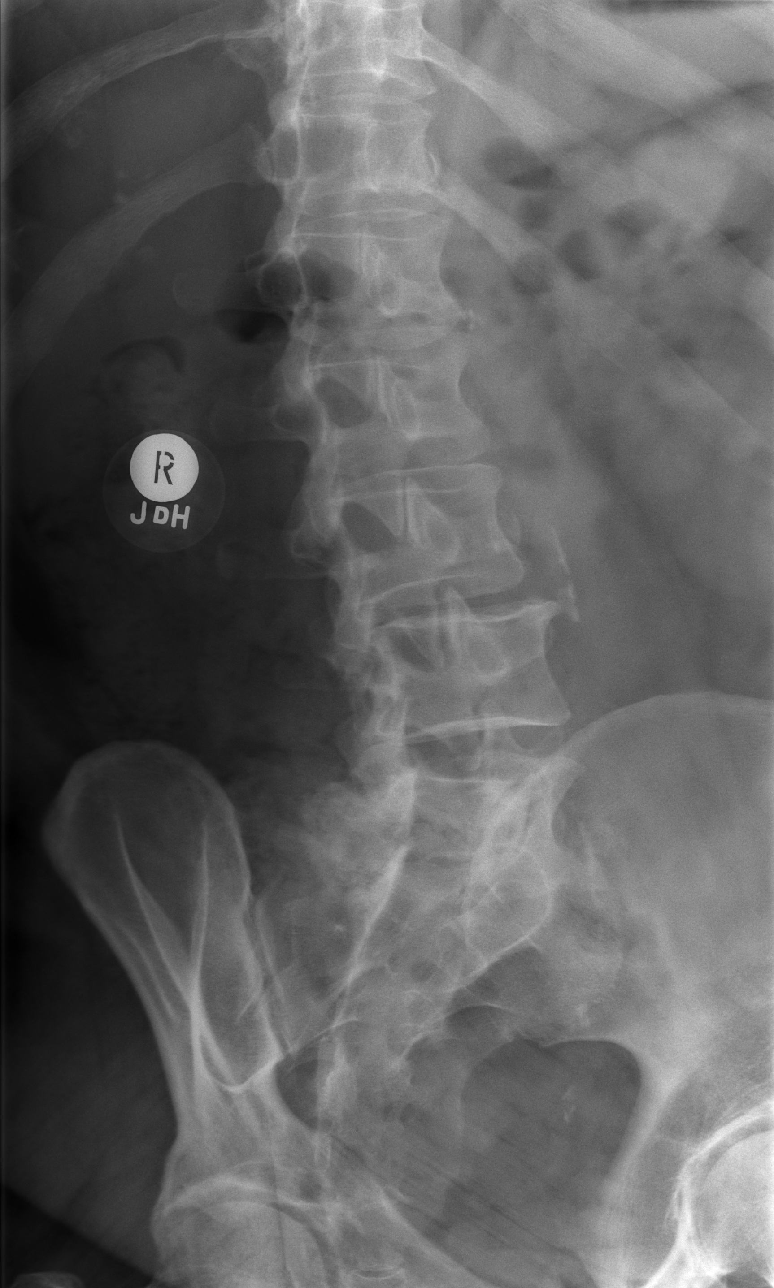

[w lumbar spine lat]
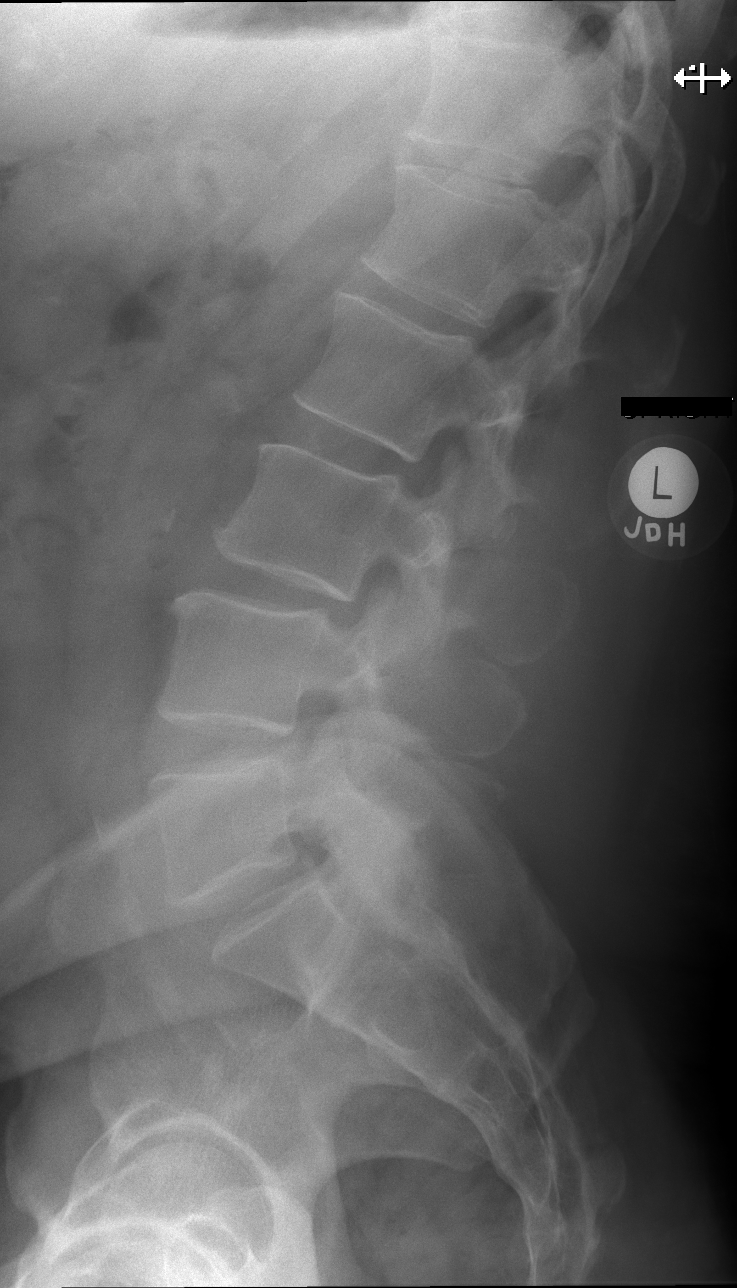

[w lumbar l-5 s-1 spot]
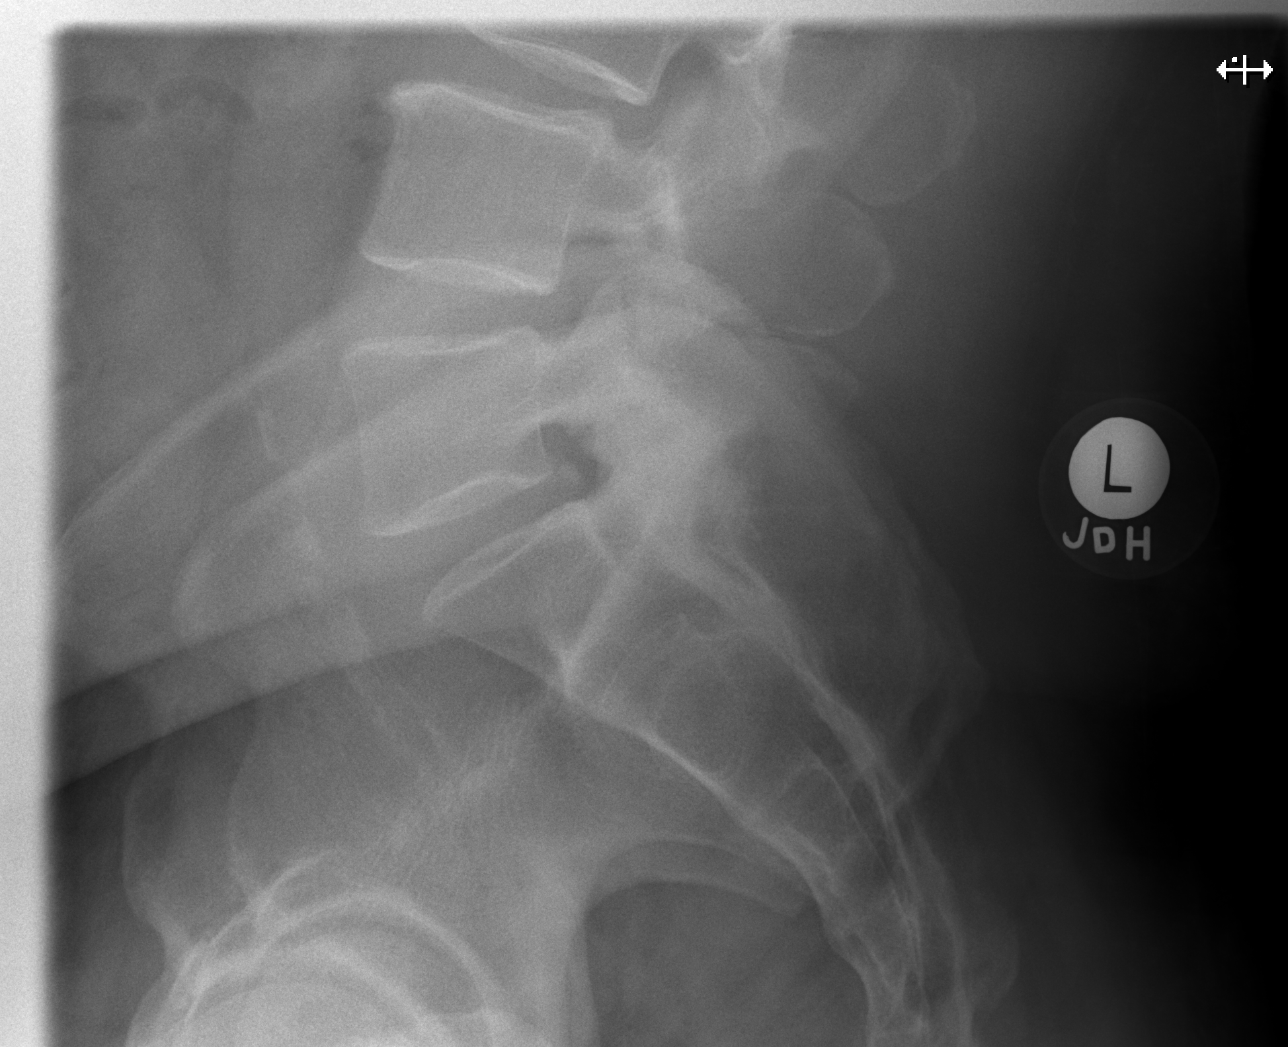

[5 of 5 positions shown; findings below may reference images not displayed]

FINDINGS: The lumbar vertebral bodies are preserved in height. The pedicles
and transverse processes are intact. The disc space heights are well
maintained. There is approximately 4 mm of grade 1 anterolisthesis
of L4 with respect L3. There is facet joint hypertrophy at L5-S1.
IMPRESSION: No acute fracture nor dislocation is observed. Minimal grade 1
anterolisthesis of L4 with respect L3 is noted and may be acute or
old.

## 2018-04-02 ENCOUNTER — Encounter: Payer: Self-pay | Admitting: *Deleted

## 2018-05-04 ENCOUNTER — Encounter: Payer: Self-pay | Admitting: Internal Medicine

## 2018-07-02 ENCOUNTER — Encounter: Payer: Self-pay | Admitting: *Deleted

## 2018-07-02 ENCOUNTER — Telehealth: Payer: Self-pay | Admitting: *Deleted

## 2018-07-02 NOTE — Telephone Encounter (Signed)
MyChart letter sent for recall colon.
